# Patient Record
Sex: Male | Born: 1985 | Race: White | Marital: Married | Smoking: Never smoker
Health system: Southern US, Community
[De-identification: ages and names within clinical notes are randomized; demographics above are authoritative.]

## PROBLEM LIST (undated history)

## (undated) DIAGNOSIS — E785 Hyperlipidemia, unspecified: Secondary | ICD-10-CM

## (undated) DIAGNOSIS — F909 Attention-deficit hyperactivity disorder, unspecified type: Secondary | ICD-10-CM

## (undated) HISTORY — DX: Attention-deficit hyperactivity disorder, unspecified type: F90.9

## (undated) HISTORY — DX: Hyperlipidemia, unspecified: E78.5

---
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
## 2006-03-14 HISTORY — PX: OTHER SURGICAL HISTORY: SHX169

## 2021-08-02 ENCOUNTER — Other Ambulatory Visit: Payer: Self-pay | Admitting: General Surgery

## 2021-08-02 ENCOUNTER — Other Ambulatory Visit (HOSPITAL_COMMUNITY): Payer: Self-pay | Admitting: General Surgery

## 2021-08-02 DIAGNOSIS — G8929 Other chronic pain: Secondary | ICD-10-CM

## 2021-08-11 ENCOUNTER — Ambulatory Visit (HOSPITAL_COMMUNITY): Payer: PRIVATE HEALTH INSURANCE

## 2021-08-11 ENCOUNTER — Encounter (HOSPITAL_COMMUNITY): Payer: Self-pay

## 2021-08-12 ENCOUNTER — Ambulatory Visit (HOSPITAL_COMMUNITY)
Admission: RE | Admit: 2021-08-12 | Discharge: 2021-08-12 | Disposition: A | Discharge status: Home / Self Care | Payer: PRIVATE HEALTH INSURANCE | Source: Ambulatory Visit | Attending: General Surgery | Admitting: General Surgery

## 2021-08-12 DIAGNOSIS — G8929 Other chronic pain: Secondary | ICD-10-CM | POA: Insufficient documentation

## 2021-08-12 DIAGNOSIS — M25512 Pain in left shoulder: Secondary | ICD-10-CM | POA: Insufficient documentation

## 2022-03-08 ENCOUNTER — Encounter: Payer: Self-pay | Admitting: Neurology

## 2022-03-08 ENCOUNTER — Other Ambulatory Visit: Payer: Self-pay

## 2022-03-08 DIAGNOSIS — R202 Paresthesia of skin: Secondary | ICD-10-CM

## 2022-04-06 ENCOUNTER — Encounter: Payer: Self-pay | Admitting: Physical Medicine and Rehabilitation

## 2022-05-02 ENCOUNTER — Encounter: Payer: PRIVATE HEALTH INSURANCE | Admitting: Physical Medicine and Rehabilitation

## 2022-05-02 ENCOUNTER — Encounter
Payer: PRIVATE HEALTH INSURANCE | Attending: Physical Medicine and Rehabilitation | Admitting: Physical Medicine and Rehabilitation

## 2022-05-02 ENCOUNTER — Ambulatory Visit: Payer: PRIVATE HEALTH INSURANCE | Admitting: Neurology

## 2022-05-02 ENCOUNTER — Encounter: Payer: Self-pay | Admitting: Physical Medicine and Rehabilitation

## 2022-05-02 VITALS — BP 133/87 | HR 95 | Ht 74.0 in | Wt 250.4 lb

## 2022-05-02 DIAGNOSIS — G8929 Other chronic pain: Secondary | ICD-10-CM

## 2022-05-02 DIAGNOSIS — R29898 Other symptoms and signs involving the musculoskeletal system: Secondary | ICD-10-CM

## 2022-05-02 DIAGNOSIS — M25512 Pain in left shoulder: Secondary | ICD-10-CM | POA: Insufficient documentation

## 2022-05-02 DIAGNOSIS — R202 Paresthesia of skin: Secondary | ICD-10-CM | POA: Insufficient documentation

## 2022-05-02 DIAGNOSIS — R200 Anesthesia of skin: Secondary | ICD-10-CM

## 2022-05-02 DIAGNOSIS — M62838 Other muscle spasm: Secondary | ICD-10-CM

## 2022-05-02 MED ORDER — GABAPENTIN 100 MG PO CAPS: 100.0000 mg | ORAL_CAPSULE | Freq: Every day | ORAL | 5 refills | Status: DC

## 2022-05-02 NOTE — Patient Instructions (Signed)
Voltaren gel to the left posterior shoulder up to 4 x dialy  Gabapentin 100 mg at nighttime  I will order MRI of your neck  Follow up in 1 month

## 2022-05-02 NOTE — Progress Notes (Unsigned)
Subjective:    Patient ID: Paul Woodard, male    DOB: 1985-10-02, 37 y.o.   MRN: [id number]  HPI   Pain Inventory Average Pain 2 Pain Right Now 2  My pain is {PAIN DESCRIPTION:21022940}  LOCATION OF PAIN  Pain, Weakness & Numbness on left side of the body from shoulder down to calf.  BOWEL Number of stools per week: 14   BLADDER Normal    Mobility walk without assistance how many minutes can you walk? All day ability to climb steps?  yes Do you have any goals in this area?  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  yes  Neuro/Psych weakness numbness tingling spasms loss of taste or smell  Prior Studies Any changes since last visit?  no CT/MRI In [facility] today   Physicians involved in your care Any changes since last visit?  no  HPI  Paul Woodard is a 37 y.o. year old male  who  has a past medical history of ADHD and Hyperlipidemia.   They are presenting to PM&R clinic as a new patient for pain management evaluation. They were referred by @APPTREFPROVNAME$ @ for treatment of L arm  pain.   Source: L arm, neck radiating numbness/tingling into middle 3 fingers of the hand; L leg numbness/tingling 4 months ago Inciting incident: Jumped 2 steps and leg went out; has been weak since then; L leg atrophy in thigh and calves  Description of pain: intermittent and aching, sharp with shoulder posterior rotation Exacerbating factors: Bringing shoulder back, laying without pillow on side or back Remitting factors: nothing; no association with time of day Red flag symptoms: No red flags for back pain endorsed in Hx or ROS  Medications tried: Topical medications (never tried) :  Nsaids (never tried) :  Tylenol  (never tried) :  Opiates  (never tried) :  Gabapentin / Lyrica  (never tried) :  TCAs  (never tried) :  SNRIs  (never tried) :  Other  (never tried) :   Other treatments: PT/OT   (moderate effect) :  Accupuncture/chiropractor/massage  (never tried) :  TENs unit (no effect) :  Injections (mild effect) : Dry needling Surgery (never tried) :   Had EMG this morning BL UE and LLE at [facility]; both that and the last EMG he had were normal  Prior UDS results: No results found for: "LABOPIA", "COCAINSCRNUR", "LABBENZ", "AMPHETMU", "THCU", "LABBARB"   ROS: Review of Systems  HENT:         Loss of taste / smell - COVID Related  Musculoskeletal:        Spasms  Neurological:  Positive for weakness and numbness.       Tingling  All other systems reviewed and are negative.   PE: Constitution: Appropriate appearance for age. No apparent distress HEENT: PERRL, EOMI grossly intact.  Resp: CTAB. No rales, rhonchi, or wheezing. Cardio: RRR. No mumurs, rubs, or gallops. No peripheral edema. Abdomen: Nondistended. Nontender. +bowel sounds. Psych: Appropriate mood and affect. Neuro: AAOx4.   Neurologic Exam:   DTRs: Reflexes were 2+ in bilateral achilles, biceps, BR and triceps; 1+ L patella Babinsky: flexor responses b/l.   Hoffmans: negative b/l Sensory exam: revealed normal sensation in all dermatomal regions in bilateral upper extremities and bilateral lower extremities Motor exam: strength 5/5 throughout bilateral upper extremities and bilateral lower extremities + Subtle reduced bulk of L hamstrings and calves compared to R  Coordination: Fine motor coordination was normal.   Gait: normal Balance: Decreased in L foot single leg stance compared to R. Can tandem walk, heel and toe walk without difficulty. Can keep balances on both feet with eyes closed.  + Spurling's with head sidebent R, symptoms on the L Negative Lhermitte's  Negative facet loading Negative slump  Assessment and Plan: Paul Woodard is a 37 y.o. year old male  who  has a past medical history of ADHD and Hyperlipidemia.   They are presenting to PM&R clinic as a new patient for treatment of L ARM  pain.   There are no diagnoses linked to this encounter.      No family history on file. Social History   Socioeconomic History   Marital status: Married    Spouse name: Not on file   Number of children: Not on file   Years of education: Not on file   Highest education level: Not on file  Occupational History   Not on file  Tobacco Use   Smoking status: Not on file   Smokeless tobacco: Not on file  Substance and Sexual Activity   Alcohol use: Not on file   Drug use: Not on file   Sexual activity: Not on file  Other Topics Concern   Not on file  Social History Narrative   Not on file   Social Determinants of Health   Financial Resource Strain: Not on file  Food Insecurity: Not on file  Transportation Needs: Not on file  Physical Activity: Not on file  Stress: Not on file  Social Connections: Not on file   *** The histories are not reviewed yet. Please review them in the "History" navigator section and refresh this [place]. No past medical history on file. There were no vitals taken for this visit.  Opioid Risk Score:   Fall Risk Score:  `1  Depression screen PHQ 2/9      No data to display          Review of Systems  HENT:         Loss of taste / smell - COVID Related  Musculoskeletal:        Spasms  Neurological:  Positive for weakness and numbness.       Tingling  All other systems reviewed and are negative.      Objective:   Physical Exam        Assessment & Plan:

## 2022-05-02 NOTE — Procedures (Signed)
[facility]  [address], [address]  [place], [place] [address] Tel: [phone] Fax: [phone] Test Date:  05/02/2022  Patient: Paul Woodard DOB: 02-24-1986 Physician: Kai Levins, MD  Sex: Male   Ref Phys: Shelly Rubenstein, DO  ID#: [id number]   Technician:    History: This is a 37 year old male with numbness in left arm and weakness in left leg.  NCV & EMG Findings: Extensive electrodiagnostic evaluation of the left upper and lower limbs with additional nerve conduction studies of the right lower limb shows: Left sural, superficial peroneal/fibular, median, ulnar, and radial sensory responses are within normal limits. Left peroneal/fibular (EDB), tibial (AH), median (APB), and ulnar (ADM) motor responses are within normal limits. Bilateral H reflexes are absent, likely technical in nature. There is no evidence of active or chronic motor axon loss changes affecting any of the tested muscles on needle examination. Motor unit configuration and recruitment pattern is within normal limits.  Impression: This is a normal study. There is no definitive electrodiagnostic evidence of a large fiber sensorimotor polyneuropathy, left cervical (C5-C8) or left lumbosacral (L3-S1) motor radiculopathy, left brachial plexopathy, or left median or ulnar mononeuropathy.    ___________________________ Kai Levins, MD    Nerve Conduction Studies Motor Nerve Results    Latency Amplitude F-Lat Segment Distance CV Comment  Site (ms) Norm (mV) Norm (ms)  (cm) (m/s) Norm   Left Fibular (EDB) Motor  Ankle 3.7  < 5.5 8.9  > 3.0        Bel fib head 11.8 - 8.1 -  Bel fib head-Ankle 37 46  > 40   Pop fossa 14.1 - 7.7 -  Pop fossa-Bel fib head 9.5 41 -   Left Median (APB) Motor  Wrist 2.9  < 3.9 7.1  > 6.0        Elbow 7.8 - 6.9 -  Elbow-Wrist 29 59  > 50   Left Tibial (AH) Motor  Ankle 3.8  < 6.0 13.3  > 8.0        Knee 13.8 - 11.9 -  Knee-Ankle 47 47  > 40   Left Ulnar (ADM) Motor   Wrist 1.95  < 3.1 13.8  > 7.0        Bel elbow 5.7 - 13.4 -  Bel elbow-Wrist 23 61  > 50   Ab elbow 7.7 - 13.0 -  Ab elbow-Bel elbow 10 50 -    Sensory Sites    Neg Peak Lat Amplitude (O-P) Segment Distance Velocity Comment  Site (ms) Norm (V) Norm  (cm) (ms)   Left Median Sensory  Wrist-Dig II 2.8  < 3.4 27  > 20 Wrist-Dig II 13    Left Radial Sensory  Forearm-Wrist 2.1  < 2.7 20  > 18 Forearm-Wrist 10    Left Superficial Fibular Sensory  14 cm-Ankle 3.3  < 4.5 13  > 5 14 cm-Ankle 14    Left Sural Sensory  Calf 4.2  < 4.5 6  > 5 Calf-Lat mall 14    Left Ulnar Sensory  Wrist-Dig V 2.7  < 3.1 18  > 12 Wrist-Dig V 11     H-Reflex Results    M-Lat H Lat H Neg Amp H-M Lat  Site (ms) (ms) Norm (mV) (ms)  Left Tibial H-Reflex  Pop fossa 7.7 -  < 35.0 - -  Right Tibial H-Reflex  Pop fossa 5.7 -  < 35.0 - -   Electromyography   Side Muscle  Ins.Act Fibs Fasc Recrt Amp Dur Poly Activation Comment  Left Tib ant Nml Nml Nml Nml Nml Nml Nml Nml N/A  Left Gastroc MH Nml Nml Nml Nml Nml Nml Nml Nml N/A  Left FDL Nml Nml Nml Nml Nml Nml Nml Nml N/A  Left Rectus fem Nml Nml Nml Nml Nml Nml Nml Nml N/A  Left Biceps fem SH Nml Nml Nml Nml Nml Nml Nml Nml N/A  Left Gluteus med Nml Nml Nml Nml Nml Nml Nml Nml N/A  Left FDI Nml Nml Nml Nml Nml Nml Nml Nml N/A  Left EIP Nml Nml Nml Nml Nml Nml Nml Nml N/A  Left FPL Nml Nml Nml Nml Nml Nml Nml Nml N/A  Left Pronator teres Nml Nml Nml Nml Nml Nml Nml Nml N/A  Left Biceps Nml Nml Nml Nml Nml Nml Nml Nml N/A  Left Triceps lat hd Nml Nml Nml Nml Nml Nml Nml Nml N/A  Left Deltoid Nml Nml Nml Nml Nml Nml Nml Nml N/A      Waveforms:  Motor           Sensory             H-Reflex

## 2022-05-03 DIAGNOSIS — R29898 Other symptoms and signs involving the musculoskeletal system: Secondary | ICD-10-CM | POA: Insufficient documentation

## 2022-05-03 DIAGNOSIS — R200 Anesthesia of skin: Secondary | ICD-10-CM | POA: Insufficient documentation

## 2022-05-03 DIAGNOSIS — R202 Paresthesia of skin: Secondary | ICD-10-CM | POA: Insufficient documentation

## 2022-05-03 DIAGNOSIS — G8929 Other chronic pain: Secondary | ICD-10-CM | POA: Insufficient documentation

## 2022-05-04 NOTE — Assessment & Plan Note (Signed)
Concerned for spinal cord related pathology either from cervical myelopathy or lumbosacral pathology, possibly worsened s/p fall. Will order MRI cervical, thoracic, and lumbar spine to evaluate for possible cord impingement. Will call patient with results.

## 2022-05-04 NOTE — Assessment & Plan Note (Signed)
See above; start gabapentin  Will follow up next visit

## 2022-05-04 NOTE — Assessment & Plan Note (Addendum)
Had EMG this morning BL UE and LLE at [facility]; reviewed results independently,  all findings were well within normal limits and essentially rule out severe radiculopathy, large fiber polyneuropathy.  Start gabapentin 100 mg QHS for neuropathy and spasms; may increase if tolerated  Follow up in 1 month

## 2022-05-21 ENCOUNTER — Ambulatory Visit
Admission: RE | Admit: 2022-05-21 | Discharge: 2022-05-21 | Disposition: A | Discharge status: Home / Self Care | Payer: PRIVATE HEALTH INSURANCE | Source: Ambulatory Visit | Attending: Physical Medicine and Rehabilitation | Admitting: Physical Medicine and Rehabilitation

## 2022-05-21 DIAGNOSIS — R200 Anesthesia of skin: Secondary | ICD-10-CM

## 2022-05-21 DIAGNOSIS — M62838 Other muscle spasm: Secondary | ICD-10-CM

## 2022-05-21 DIAGNOSIS — R29898 Other symptoms and signs involving the musculoskeletal system: Secondary | ICD-10-CM

## 2022-05-21 DIAGNOSIS — G8929 Other chronic pain: Secondary | ICD-10-CM

## 2022-05-24 ENCOUNTER — Telehealth: Payer: Self-pay | Admitting: Physical Medicine and Rehabilitation

## 2022-05-24 NOTE — Telephone Encounter (Signed)
Reviewed results of MRI as above, discussed referral for cervical ESI vs. Medication management, patient opted to increase gabapentin to 100 mg TID until follow up.  Also discussed leg weakness and muscle bulk reduced on L side; with moderate stenosis and no EMG findings unsure of cause at this time, will re-evaluate on follow up.   Paul Gowda, DO 05/24/2022

## 2022-06-06 ENCOUNTER — Encounter
Payer: PRIVATE HEALTH INSURANCE | Attending: Physical Medicine and Rehabilitation | Admitting: Physical Medicine and Rehabilitation

## 2022-06-06 VITALS — BP 139/84 | HR 77 | Ht 74.0 in | Wt 254.0 lb

## 2022-06-06 DIAGNOSIS — R200 Anesthesia of skin: Secondary | ICD-10-CM | POA: Diagnosis not present

## 2022-06-06 DIAGNOSIS — M25512 Pain in left shoulder: Secondary | ICD-10-CM | POA: Insufficient documentation

## 2022-06-06 DIAGNOSIS — R202 Paresthesia of skin: Secondary | ICD-10-CM | POA: Insufficient documentation

## 2022-06-06 DIAGNOSIS — G8929 Other chronic pain: Secondary | ICD-10-CM | POA: Diagnosis not present

## 2022-06-06 DIAGNOSIS — R29898 Other symptoms and signs involving the musculoskeletal system: Secondary | ICD-10-CM | POA: Insufficient documentation

## 2022-06-06 MED ORDER — GABAPENTIN 300 MG PO CAPS: ORAL_CAPSULE | ORAL | 3 refills | Status: AC

## 2022-06-06 NOTE — Patient Instructions (Signed)
I have sent a prescription for gabapentin into your pharmacy.  Please take one 300 mg capsule twice a day.  If no side effects at this dose, can increase to one 300 mg capsule 3 times daily.  I have referred you to physical therapy for your neck and shoulder.  I referred you to neurology for neuromuscular evaluation for your left lower extremity.  Follow-up with me in 2 months.

## 2022-06-06 NOTE — Progress Notes (Signed)
Subjective:    Patient ID: Paul Woodard, male    DOB: 1985-10-11, 37 y.o.   MRN: [id number]  HPI Wlliam Woodard is a 37 y.o. year old male  who  has a past medical history of ADHD and Hyperlipidemia.   They are presenting to PM&R clinic as a follow up for treatment of L ARM pain in posterior shoulder + neuropathic pains into L fingers; also symptoms of neuropathy and weakness in LLE worsened s/p fall.     Plan from last visit:    Numbness and tingling Assessment & Plan: Had EMG this morning BL UE and LLE at [facility]; reviewed results independently,  all findings were well within normal limits and essentially rule out severe radiculopathy, large fiber polyneuropathy.   Start gabapentin 100 mg QHS for neuropathy and spasms; may increase if tolerated   Follow up in 1 month   Orders: -     MR CERVICAL SPINE [place]; Future -     MR THORACIC SPINE WO CONTR[place]; Future -     MR LUMBAR SPINE WO CONTRAST; Future   Chronic left shoulder pain Assessment & Plan: See above; start gabapentin   Will follow up next visit   Orders: -     MR CERVICAL SPINE [place]; Future -     MR THORACIC SPINE WO CONTRAST; Future   Left leg weakness Assessment & Plan: Concerned for spinal cord related pathology either from cervical myelopathy or lumbosacral pathology, possibly worsened s/p fall. Will order MRI cervical, thoracic, and lumbar spine to evaluate for possible cord impingement. Will call patient with results.    Orders: -     MR CERVICAL SPINE WO CONTRAST; Future -     MR THORACIC SPINE WO CONTRAST; Future -     MR LUMBAR SPINE WO CONTRAST; Future   Muscle spasm of left lower extremity -     MR CERVICAL SPINE [place] CONTR[place]; Future -     MR THORACIC SPINE WO CONTRAST; Future -     MR LUMBAR SPINE WO CONTRAST; Future   Other orders -     Gabapentin; Take 1 capsule (100 mg total) by mouth at bedtime.  Dispense: 30 capsule; Refill: 5    Reviewed results of MRI as above, discussed  referral for cervical ESI vs. Medication management, patient opted to increase gabapentin to 100 mg TID until follow up.   Also discussed leg weakness and muscle bulk reduced on L side; with moderate stenosis and no EMG findings unsure of cause at this time, will re-evaluate on follow up.       Interval Hx:  - Therapies: none  - Follow ups: none  - Falls: none  - DME: none  - Medications: No difference in symptoms with Gabapentin TID.   - Other concerns: Ongoing concerns with loss of muscle bulk and weakness in LLE.     Pain Inventory Average Pain 5 Pain Right Now 5 My pain is intermittent, sharp, burning, tingling, and numbness  In the last 24 hours, has pain interfered with the following? General activity 6 Relation with others 6 Enjoyment of life 6 What TIME of day is your pain at its worst? morning , daytime, evening, and night Sleep (in general) Fair  Pain is worse with: walking, bending, inactivity, standing, and some activites Pain improves with:  nothing Relief from Meds: 2  No family history on file. Social History   Socioeconomic History   Marital status: Married    Spouse name:  Not on file   Number of children: Not on file   Years of education: Not on file   Highest education level: Not on file  Occupational History   Not on file  Tobacco Use   Smoking status: Never   Smokeless tobacco: Never  Vaping Use   Vaping Use: Never used  Substance and Sexual Activity   Alcohol use: Yes    Comment: daily beer   Drug use: Not Currently   Sexual activity: Not on file  Other Topics Concern   Not on file  Social History Narrative   Not on file   Social Determinants of Health   Financial Resource Strain: Not on file  Food Insecurity: Not on file  Transportation Needs: Not on file  Physical Activity: Not on file  Stress: Not on file  Social Connections: Not on file   Past Surgical History:  Procedure Laterality Date   right shoulder rpair Right 2008    [place]   Past Surgical History:  Procedure Laterality Date   right shoulder rpair Right 2008   [place]   Past Medical History:  Diagnosis Date   ADHD    Hyperlipidemia    BP 139/84   Pulse 77   Ht 6\' 2"  (1.88 m)   Wt 254 lb (115.2 kg)   SpO2 98%   BMI 32.61 kg/m   Opioid Risk Score:   Fall Risk Score:  `1  Depression screen [facility] 2/9     05/02/2022    2:38 PM  Depression screen PHQ 2/9  Decreased Interest 1  Down, Depressed, Hopeless 0  PHQ - 2 Score 1  Altered sleeping 2  Tired, decreased energy 2  Change in appetite 2  Feeling bad or failure about yourself  0  Trouble concentrating 3  Moving slowly or fidgety/restless 2  Suicidal thoughts 0  PHQ-9 Score 12     Review of Systems  Musculoskeletal:        Whole left side pain  All other systems reviewed and are negative.     Objective:   Physical Exam   PE: Constitution: Appropriate appearance for age. No apparent distress  Resp: No respiratory distress. No accessory muscle usage. on RA and CTAB Cardio: Well perfused appearance. No peripheral edema. Abdomen: Nondistended. Nontender.   Psych: Appropriate mood and affect. Neuro: AAOx4. No apparent cognitive deficits   Neurologic Exam:   DTRs: Reflexes were 2+ in bilateral achilles, patella, biceps, BR and triceps. Babinsky: flexor responses b/l.   Hoffmans: negative b/l Sensory exam: revealed normal sensation in all dermatomal regions in bilateral upper extremities and bilateral lower extremities Motor exam: strength 5/5 throughout right lower extremity and with exception of LLE deceased bulk in calf, quad; 4+/5 in HF and PF  Coordination: Fine motor coordination was normal.   Gait: normal   Neck: Decreased ROM neck flexion, extension, and L>R rotation'; sidebending WNL Negative spurling's +TTP over left trapezius with numb/tingling around neck       Assessment & Plan:   Paul Woodard is a 37 y.o. year old male  who  has a past medical history of ADHD and Hyperlipidemia.   They are presenting to PM&R clinic as a new patient for treatment of L ARM pain in posterior shoulder + neuropathic pains into L fingers; weakness in LLE worsened s/p fall.  Chronic left shoulder pain Assessment & Plan: EMG BL UE wi[place] normal  MRI cervical spine 2024:  IMPRESSION: 1. Disc degeneration with multiple disc protrusions, most notable at C5-6 where there is mild-to-moderate spinal stenosis and severe left neural foraminal stenosis. 2. Mild spinal stenosis and moderate left neural foraminal stenosis at C4-5. 3. Mild spinal stenosis and moderate right neural foraminal stenosis at C6-7.  I have referred you to physical therapy for your neck and shoulder.  Orders: -     Ambulatory referral to Physical Therapy  Left leg weakness Assessment & Plan: LLE EMG with [place] normal  MRI lumbar c/t/l spine 2024 without severe spinal stenosis; lumbar findings:  IMPRESSION: 1. 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 No compressive spinal stenosis. 2. Moderate left and mild right neural foraminal stenosis at L4-5.  I referred you to neurology for neuromuscular evaluation for your left lower extremity.  Follow-up with me in 2 months.  Orders: -     Ambulatory referral to Neurology  Numbness and tingling Assessment & Plan: I have sent a prescription for gabapentin into your pharmacy.  Please take one 300 mg capsule twice a day.  If no side effects at this dose, can increase to one 300 mg capsule 3 times daily.   Orders: -     Ambulatory referral to Physical Therapy  Other orders -     Gabapentin; Start with 1 capsule twice daily. After 1 week, if no side effects, can increase to 1 capsule three times daily  Dispense: 90 capsule; Refill: 3

## 2022-06-11 NOTE — Assessment & Plan Note (Addendum)
LLE EMG with [place] normal  MRI lumbar c/t/l spine 2024 without severe spinal stenosis; lumbar findings:  IMPRESSION: 1. 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 No compressive spinal stenosis. 2. Moderate left and mild right neural foraminal stenosis at L4-5.  I referred you to neurology for neuromuscular evaluation for your left lower extremity.  Follow-up with me in 2 months.

## 2022-06-11 NOTE — Assessment & Plan Note (Signed)
I have sent a prescription for gabapentin into your pharmacy.  Please take one 300 mg capsule twice a day.  If no side effects at this dose, can increase to one 300 mg capsule 3 times daily.

## 2022-06-11 NOTE — Assessment & Plan Note (Addendum)
EMG BL UE wiuth [place] normal  MRI cervical spine 2024:  IMPRESSION: 1. Disc degeneration with multiple disc protrusions, most notable at C5-6 where there is mild-to-moderate spinal stenosis and severe left neural foraminal stenosis. 2. Mild spinal stenosis and moderate left neural foraminal stenosis at C4-5. 3. Mild spinal stenosis and moderate right neural foraminal stenosis at C6-7.  I have referred you to physical therapy for your neck and shoulder.

## 2022-06-13 ENCOUNTER — Ambulatory Visit: Payer: PRIVATE HEALTH INSURANCE | Attending: Physical Medicine and Rehabilitation | Admitting: Physical Therapy

## 2022-06-13 DIAGNOSIS — R200 Anesthesia of skin: Secondary | ICD-10-CM | POA: Insufficient documentation

## 2022-06-13 DIAGNOSIS — M25512 Pain in left shoulder: Secondary | ICD-10-CM | POA: Diagnosis not present

## 2022-06-13 DIAGNOSIS — R202 Paresthesia of skin: Secondary | ICD-10-CM | POA: Diagnosis not present

## 2022-06-13 DIAGNOSIS — M6281 Muscle weakness (generalized): Secondary | ICD-10-CM | POA: Diagnosis present

## 2022-06-13 DIAGNOSIS — M542 Cervicalgia: Secondary | ICD-10-CM | POA: Diagnosis present

## 2022-06-13 DIAGNOSIS — M5412 Radiculopathy, cervical region: Secondary | ICD-10-CM | POA: Insufficient documentation

## 2022-06-13 DIAGNOSIS — G8929 Other chronic pain: Secondary | ICD-10-CM | POA: Diagnosis not present

## 2022-06-13 NOTE — Therapy (Signed)
OUTPATIENT PHYSICAL THERAPY CERVICAL EVALUATION   Patient Name: Dillan Tourville MRN: [id number] DOB:[birth year], 37 y.o., male Today's Date: 06/13/2022  END OF SESSION:  PT End of Session - 06/13/22 1323     Visit Number 1    Number of Visits 5   with eval   Date for PT Re-Evaluation 07/25/22    Authorization Type Generic Commercial    PT Start Time 1322   pt arrived late   PT Stop Time 1355   eval   PT Time Calculation (min) 33 min    Activity Tolerance Patient tolerated treatment well    Behavior During Therapy [facility] for tasks assessed/performed             Past Medical History:  Diagnosis Date   ADHD    Hyperlipidemia    Past Surgical History:  Procedure Laterality Date   right shoulder rpair Right 2008   [place]   Patient Active Problem List   Diagnosis Date Noted   Numbness and tingling 05/03/2022   Chronic left shoulder pain 05/03/2022   Left leg weakness 05/03/2022    PCP: Marcille Buffy, DO  REFERRING PROVIDER: Gertie Gowda, DO  REFERRING DIAG: [phone] (ICD-10-CM) - Chronic left shoulder pain R20.0,R20.2 (ICD-10-CM) - Numbness and tingling  THERAPY DIAG:  Muscle weakness (generalized)  Cervicalgia  Radiculopathy, cervical region  Rationale for Evaluation and Treatment: Rehabilitation  ONSET DATE: 06/06/2022 (date of referral)  SUBJECTIVE:                                                                                                                                                                                                         SUBJECTIVE STATEMENT: Pt reports he has been having L arm tingling and pain that started over a year ago and kept getting worse. Pt states that this starts with pain/tightness at the base of his neck that then travels down his arm, he would say that it bothers him daily/constantly. Pt also with muscle loss in his LLE and with sporadic but shooting pain in his quads. Pt reports  that he had has issues in his LLE for about 6 months. When descending the stairs pt's LLE feels "weak" and "foreign". Pt reports that cervical retraction severely increases the symptoms in his LUE so much so that the symptoms can linger for days after performing this movement. Pt also reports that just being sedentary makes his symptoms worse. Pt did try PT about 6-8 months ago (at [facility]) and they did dry needling for his neck  and shoulders, stretching, cervical traction etc with minimal relief of symptoms. Pt also plays darts and throws with his L hand.  Hand dominance: Left  PERTINENT HISTORY:  ADHD and Hyperlipidemia  PAIN:  Are you having pain? Yes: NPRS scale: discomfort/10 Pain location: L neck and down into L arm Pain description: "uncomfortable", numbness if sitting a certain way, can feel it creeping into R side Aggravating factors: sitting a certain way, can't lay on R side with L arm extended, can't sit at desk for more than 5 min Relieving factors: N/A  PRECAUTIONS: None  WEIGHT BEARING RESTRICTIONS: No  FALLS:  Has patient fallen in last 6 months? No  LIVING ENVIRONMENT: Lives with: lives with their family (wife) Lives in: House/apartment Stairs:  Yes, does not need to use rails currently Has following equipment at home: None  OCCUPATION: Scientist, clinical (histocompatibility and immunogenetics); symptoms have not limited ability to work (lifts heavy kegs, food boxes, etc)  PLOF: Independent with gait and Independent with transfers  PATIENT GOALS: "I want to get rid of the tingling" "Feel healthy again"  NEXT MD VISIT: Aug 03, 2022 with Dr. Tressa Busman  OBJECTIVE:   DIAGNOSTIC FINDINGS:   Cervical Spine MRI 05/21/22 IMPRESSION: 1. Disc degeneration with multiple disc protrusions, most notable at C5-6 where there is mild-to-moderate spinal stenosis and severe left neural foraminal stenosis. 2. Mild spinal stenosis and moderate left neural foraminal stenosis at C4-5. 3. Mild spinal  stenosis and moderate right neural foraminal stenosis at C6-7.  Thoracic Spine MRI 05/21/22 IMPRESSION: Small disc protrusion at T4-5 without stenosis.  Lumbar Spine MRI 05/21/22 IMPRESSION: 1. 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 No compressive spinal stenosis. 2. Moderate left and mild right neural foraminal stenosis at L4-5.  LUE and LLE EMG 05/02/22 Impression: This is a normal study. There is no definitive electrodiagnostic evidence of a large fiber sensorimotor polyneuropathy, left cervical (C5-C8) or left lumbosacral (L3-S1) motor radiculopathy, left brachial plexopathy, or left median or ulnar mononeuropathy.  PATIENT SURVEYS:  NDI 12/50  COGNITION: Overall cognitive status: Within functional limits for tasks assessed  SENSATION: WFL Light touch sensation intact in BUE and BLE Proprioception intact  LLE just feels "weak" at times such as when descending stairs  POSTURE: rounded shoulders and forward head  PALPATION: TTP in L UT and periscapular region; tightness in L UT with L scapula abducted   CERVICAL ROM:   Active ROM A/PROM (deg) eval  Flexion 45  Extension 25 (pain in L levator scap)  Right lateral flexion 30 (pain in R lev scap)  Left lateral flexion 30 (pain and tingling in L levator scap)  Right rotation 45 (tingling in L side)  Left rotation 50   (Blank rows = not tested)  UPPER EXTREMITY ROM:  Active ROM Right eval Left eval  Shoulder flexion [facility]  Shoulder extension    Shoulder abduction [facility]  Shoulder adduction    Shoulder extension    Shoulder internal rotation    Shoulder external rotation    Elbow flexion [facility] WFL  Elbow extension [facility] WFL  Wrist flexion    Wrist extension    Wrist ulnar deviation    Wrist radial deviation    Wrist pronation    Wrist supination     (Blank rows = not tested)  UPPER EXTREMITY MMT:  MMT Right eval Left eval  Shoulder flexion 5 5  Shoulder extension    Shoulder  abduction 5 5  Shoulder adduction    Shoulder extension  Shoulder internal rotation    Shoulder external rotation    Middle trapezius    Lower trapezius    Elbow flexion 5 5  Elbow extension 5 5  Wrist flexion    Wrist extension    Wrist ulnar deviation    Wrist radial deviation    Wrist pronation    Wrist supination    Grip strength WFL WFL   (Blank rows = not tested)  CERVICAL SPECIAL TESTS:  Upper limb tension test (ULTT): Positive, Spurling's test: Positive, and Distraction test: Positive (+ for median nerve on L side)   TODAY'S TREATMENT:                                                                                                                              PT Evaluation   PATIENT EDUCATION:  Education details: Eval findings, PT POC Person educated: Patient Education method: Explanation Education comprehension: verbalized understanding and needs further education  HOME EXERCISE PROGRAM: To be initiated next session  ASSESSMENT:  CLINICAL IMPRESSION: Patient is a 37 year old male referred to Neuro OPPT for chronic L-sided neck pain and cervical radiculopathy.   Pt's PMH is significant for: ADHD and Hyperlipidemia. The following deficits were present during the exam: decreased cervical ROM, increased disability level based on NDI, + neural tension symptoms down LUE. Pt would benefit from skilled PT to address these impairments and functional limitations to maximize functional mobility independence.   OBJECTIVE IMPAIRMENTS: decreased knowledge of condition, decreased mobility, decreased ROM, impaired perceived functional ability, impaired sensation, impaired UE functional use, improper body mechanics, postural dysfunction, and pain.   ACTIVITY LIMITATIONS: sitting, sleeping, and stairs  PARTICIPATION LIMITATIONS: community activity  PERSONAL FACTORS: Time since onset of injury/illness/exacerbation and 1-2 comorbidities:    ADHD and Hyperlipidemiaare also  affecting patient's functional outcome.   REHAB POTENTIAL: Fair time since onset; has tried PT in the past year with little success  CLINICAL DECISION MAKING: Stable/uncomplicated  EVALUATION COMPLEXITY: Low   GOALS: Goals reviewed with patient? Yes  SHORT TERM GOALS=LONG TERM GOALS due to length of POC  LONG TERM GOALS: Target date: 07/12/2022  Pt will be independent with final HEP for improved strength, balance, transfers and gait. Baseline: not established at eval Goal status: INITIAL  2.  Pt will decrease score on NDI to 7/50 to demonstrate decreased disability level Baseline: 12/50 (4/1) Goal status: INITIAL  3.  Pt will demonstrate (-) testing of ULNT for median nerve in LUE Baseline: (+) for median nerve in LUE (4/1) Goal status: INITIAL  4.  LEFS to be assessed and goal written Baseline:  Goal status: INITIAL  5.  Pt will increase cervical extension by >/= 10 degrees to demonstrate improved function Baseline: 25 degrees cervical extension (4/1) Goal status: INITIAL    PLAN:  PT FREQUENCY: 1x/week  PT DURATION: 4 weeks  PLANNED INTERVENTIONS: Therapeutic exercises, Therapeutic activity, Neuromuscular re-education, Balance training, Gait training, Patient/Family education, Self Care, Joint  mobilization, Stair training, Dry Needling, Electrical stimulation, Spinal manipulation, Spinal mobilization, Cryotherapy, Moist heat, Taping, Traction, Manual therapy, and Re-evaluation  PLAN FOR NEXT SESSION: assess LEFS and write LTG, initiate HEP for ROM (levator scap strech, UT stretches), L scapular stability and postural stabilization exercises; also work on LLE strengthening; manual therapy (cervical traction)   Excell Seltzer, PT, DPT, CSRS 06/13/2022, 3:26 PM

## 2022-06-23 ENCOUNTER — Ambulatory Visit: Payer: PRIVATE HEALTH INSURANCE | Admitting: Physical Therapy

## 2022-06-23 ENCOUNTER — Encounter: Payer: Self-pay | Admitting: Physical Therapy

## 2022-06-23 DIAGNOSIS — M6281 Muscle weakness (generalized): Secondary | ICD-10-CM | POA: Diagnosis not present

## 2022-06-23 DIAGNOSIS — M5412 Radiculopathy, cervical region: Secondary | ICD-10-CM

## 2022-06-23 DIAGNOSIS — M542 Cervicalgia: Secondary | ICD-10-CM

## 2022-06-23 NOTE — Patient Instructions (Signed)
Access Code: [email or web address] URL: [email or web address] Date: 06/23/2022 Prepared by: Camille Bal  Exercises - Seated Upper Trapezius Stretch  - 1 x daily - 4-5 x weekly - 1 sets - 2 reps - 45 seconds hold - Seated Levator Scapulae Stretch  - 1 x daily - 4-5 x weekly - 3 sets - 10 reps - Scapular retraction with resistance  - 1 x daily - 5 x weekly - 2 sets - 12 reps - Standing Median Nerve Glide  - 1 x daily - 5 x weekly - 2 sets - 10 reps - Seated Sciatic Tensioner  - 1 x daily - 5 x weekly - 1-2 sets - 12 reps

## 2022-06-23 NOTE — Therapy (Signed)
OUTPATIENT PHYSICAL THERAPY CERVICAL TREATMENT   Patient Name: Paul Woodard MRN: [id number] DOB:11/09/1985, 37 y.o., male Today's Date: 06/23/2022  END OF SESSION:  PT End of Session - 06/23/22 0848     Visit Number 2    Number of Visits 5   with eval   Date for PT Re-Evaluation 07/25/22    Authorization Type Generic Commercial    PT Start Time 0848    PT Stop Time 0926    PT Time Calculation (min) 38 min    Activity Tolerance Patient tolerated treatment well    [facility] tasks assessed/performed             Past Medical History:  Diagnosis Date   ADHD    Hyperlipidemia    Past Surgical History:  Procedure Laterality Date   right shoulder rpair Right 2008   TONSILLECTOMY AND ADENOIDECTOMY  1998   Patient Active Problem List   Diagnosis Date Noted   Numbness and tingling 05/03/2022   Chronic left shoulder pain 05/03/2022   Left leg weakness 05/03/2022    PCP: Ed BlalockAnderson, Amber H, DO  REFERRING PROVIDER: Angelina SheriffEngler, Morgan C, DO  REFERRING DIAG: [phone],G89.29 (ICD-10-CM) - Chronic left shoulder pain R20.0,R20.2 (ICD-10-CM) - Numbness and tingling  THERAPY DIAG:  Muscle weakness (generalized)  Cervicalgia  Radiculopathy, cervical region  Rationale for Evaluation and Treatment: Rehabilitation  ONSET DATE: 06/06/2022 (date of referral)  SUBJECTIVE:                                                                                                                                                                                                         SUBJECTIVE STATEMENT: Patient is having neck pain this morning.  He states his left arm is already going numb.  Hand dominance: Left  PERTINENT HISTORY:  ADHD and Hyperlipidemia  PAIN:  Are you having pain? Yes: NPRS scale: 5-6/10 Pain location: Top left of shoulder blade and base of neck Pain description: "uncomfortable", numbness if sitting a certain way, can feel it creeping into R  side Aggravating factors: sitting a certain way, can't lay on R side with L arm extended, can't sit at desk for more than 5 min Relieving factors: N/A  PRECAUTIONS: None  WEIGHT BEARING RESTRICTIONS: No  FALLS:  Has patient fallen in last 6 months? No  LIVING ENVIRONMENT: Lives with: lives with their family (wife) Lives in: House/apartment Stairs:  Yes, does not need to use rails currently Has following equipment at home: None  OCCUPATION: Naval architectrestaurant manager; symptoms have not limited ability to work (  lifts heavy kegs, food boxes, etc)  PLOF: Independent with gait and Independent with transfers  PATIENT GOALS: "I want to get rid of the tingling" "Feel healthy again"  NEXT MD VISIT: Aug 03, 2022 with Dr. Shearon Stalls  OBJECTIVE:   DIAGNOSTIC FINDINGS:   Cervical Spine MRI 05/21/22 IMPRESSION: 1. Disc degeneration with multiple disc protrusions, most notable at C5-6 where there is mild-to-moderate spinal stenosis and severe left neural foraminal stenosis. 2. Mild spinal stenosis and moderate left neural foraminal stenosis at C4-5. 3. Mild spinal stenosis and moderate right neural foraminal stenosis at C6-7.  Thoracic Spine MRI 05/21/22 IMPRESSION: Small disc protrusion at T4-5 without stenosis.  Lumbar Spine MRI 05/21/22 IMPRESSION: 1. 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 No compressive spinal stenosis. 2. Moderate left and mild right neural foraminal stenosis at L4-5.  LUE and LLE EMG 05/02/22 Impression: This is a normal study. There is no definitive electrodiagnostic evidence of a large fiber sensorimotor polyneuropathy, left cervical (C5-C8) or left lumbosacral (L3-S1) motor radiculopathy, left brachial plexopathy, or left median or ulnar mononeuropathy.  PATIENT SURVEYS:  NDI 12/50  COGNITION: Overall cognitive status: Within functional limits for tasks assessed  SENSATION: WFL Light touch sensation intact in BUE and BLE Proprioception  intact  LLE just feels "weak" at times such as when descending stairs  POSTURE: rounded shoulders and forward head  PALPATION: TTP in L UT and periscapular region; tightness in L UT with L scapula abducted   CERVICAL ROM:   Active ROM A/PROM (deg) eval  Flexion 45  Extension 25 (pain in L levator scap)  Right lateral flexion 30 (pain in R lev scap)  Left lateral flexion 30 (pain and tingling in L levator scap)  Right rotation 45 (tingling in L side)  Left rotation 50   (Blank rows = not tested)  UPPER EXTREMITY ROM:  Active ROM Right eval Left eval  Shoulder flexion [facility]  Shoulder extension    Shoulder abduction [facility]  Shoulder adduction    Shoulder extension    Shoulder internal rotation    Shoulder external rotation    Elbow flexion WFL WFL  Elbow extension [facility] WFL  Wrist flexion    Wrist extension    Wrist ulnar deviation    Wrist radial deviation    Wrist pronation    Wrist supination     (Blank rows = not tested)  UPPER EXTREMITY MMT:  MMT Right eval Left eval  Shoulder flexion 5 5  Shoulder extension    Shoulder abduction 5 5  Shoulder adduction    Shoulder extension    Shoulder internal rotation    Shoulder external rotation    Middle trapezius    Lower trapezius    Elbow flexion 5 5  Elbow extension 5 5  Wrist flexion    Wrist extension    Wrist ulnar deviation    Wrist radial deviation    Wrist pronation    Wrist supination    Grip strength WFL WFL   (Blank rows = not tested)  CERVICAL SPECIAL TESTS:  Upper limb tension test (ULTT): Positive, Spurling's test: Positive, and Distraction test: Positive (+ for median nerve on L side)   TODAY'S TREATMENT:                                                                                                                              -  LLE slump + -LEFS: 64/80 = 80% -PT performs manual cervical traction x10 minutes w/ cervical myofascial release and passive cervical flexion/rotation and  upper trap stretch bilaterally -Upper trap stretch 2x45 seconds each UE, tightness noted bilaterally -Levator scapulae stretch w/ edu on head angle to deepen stretc -Scapular retractions w/ red theraband x12 -Standing median nerve tensioner x12, pt with high level of symptoms in end position requiring breaks throughout due to numbness -Sciatic nerve flossing into tensioner (added tensioner to HEP), edu on anatomy and nerve glide purpose  PATIENT EDUCATION:  Education details: Use nerve glides when symptomatic in addition to when just generally doing HEP. Person educated: Patient Education method: Explanation Education comprehension: verbalized understanding and needs further education  HOME EXERCISE PROGRAM: Access Code: [email or web address] URL: [email or web address] Date: 06/23/2022 Prepared by: Camille Bal  Exercises - Seated Upper Trapezius Stretch  - 1 x daily - 4-5 x weekly - 1 sets - 2 reps - 45 seconds hold - Seated Levator Scapulae Stretch  - 1 x daily - 4-5 x weekly - 3 sets - 10 reps - Scapular retraction with resistance  - 1 x daily - 5 x weekly - 2 sets - 12 reps - Standing Median Nerve Glide  - 1 x daily - 5 x weekly - 2 sets - 10 reps - Seated Sciatic Tensioner  - 1 x daily - 5 x weekly - 1-2 sets - 12 reps  ASSESSMENT:  CLINICAL IMPRESSION: Patient has ongoing appearance of left sided radiculopathy of the cervical region affecting the median nerve distribution primarily.  Initiated HEP to address nerve mobility and cervical and periscapular mobility with appropriate response from patient.  He continues to benefit from PT to address ongoing active symptoms and promote improved symptom management.   OBJECTIVE IMPAIRMENTS: decreased knowledge of condition, decreased mobility, decreased ROM, impaired perceived functional ability, impaired sensation, impaired UE functional use, improper body mechanics, postural dysfunction, and pain.   ACTIVITY LIMITATIONS:  sitting, sleeping, and stairs  PARTICIPATION LIMITATIONS: community activity  PERSONAL FACTORS: Time since onset of injury/illness/exacerbation and 1-2 comorbidities:    ADHD and Hyperlipidemiaare also affecting patient's functional outcome.   REHAB POTENTIAL: Fair time since onset; has tried PT in the past year with little success  CLINICAL DECISION MAKING: Stable/uncomplicated  EVALUATION COMPLEXITY: Low   GOALS: Goals reviewed with patient? Yes  SHORT TERM GOALS=LONG TERM GOALS due to length of POC  LONG TERM GOALS: Target date: 07/12/2022  Pt will be independent with final HEP for improved strength, balance, transfers and gait. Baseline: not established at eval Goal status: INITIAL  2.  Pt will decrease score on NDI to 7/50 to demonstrate decreased disability level Baseline: 12/50 (4/1) Goal status: INITIAL  3.  Pt will demonstrate (-) testing of ULNT for median nerve in LUE Baseline: (+) for median nerve in LUE (4/1) Goal status: INITIAL  4.  LEFS to be assessed and goal written Baseline:  Goal status: INITIAL  5.  Pt will increase cervical extension by >/= 10 degrees to demonstrate improved function Baseline: 25 degrees cervical extension (4/1) Goal status: INITIAL    PLAN:  PT FREQUENCY: 1x/week  PT DURATION: 4 weeks  PLANNED INTERVENTIONS: Therapeutic exercises, Therapeutic activity, Neuromuscular re-education, Balance training, Gait training, Patient/Family education, Self Care, Joint mobilization, Stair training, Dry Needling, Electrical stimulation, Spinal manipulation, Spinal mobilization, Cryotherapy, Moist heat, Taping, Traction, Manual therapy, and Re-evaluation  PLAN FOR NEXT SESSION:  modify HEP prn.  L scapular stability and postural stabilization exercises-rows, lat pulldowns,  pec stretch; also work on LLE strengthening; manual therapy (cervical traction), measure left vs right LE (calves and thighs) for atrophy   Sadie Haber, PT,  DPT 06/23/2022, 9:51 AM

## 2022-06-29 ENCOUNTER — Ambulatory Visit: Payer: PRIVATE HEALTH INSURANCE | Admitting: Physical Therapy

## 2022-06-29 DIAGNOSIS — M6281 Muscle weakness (generalized): Secondary | ICD-10-CM | POA: Diagnosis not present

## 2022-06-29 DIAGNOSIS — M5412 Radiculopathy, cervical region: Secondary | ICD-10-CM

## 2022-06-29 DIAGNOSIS — M542 Cervicalgia: Secondary | ICD-10-CM

## 2022-06-29 NOTE — Therapy (Signed)
OUTPATIENT PHYSICAL THERAPY CERVICAL TREATMENT   Patient Name: Paul Woodard MRN: [id number] DOB:02-20-1986, 37 y.o., male Today's Date: 06/29/2022  END OF SESSION:  PT End of Session - 06/29/22 0802     Visit Number 3    Number of Visits 5   with eval   Date for PT Re-Evaluation 07/25/22    Authorization Type Generic Commercial    PT Start Time 0800    PT Stop Time 0841    PT Time Calculation (min) 41 min    Activity Tolerance Patient tolerated treatment well    Behavior During Therapy [facility] for tasks assessed/performed              Past Medical History:  Diagnosis Date   ADHD    Hyperlipidemia    Past Surgical History:  Procedure Laterality Date   right shoulder rpair Right 2008   TONSILLECTOMY AND ADENOIDECTOMY  1998   Patient Active Problem List   Diagnosis Date Noted   Numbness and tingling 05/03/2022   Chronic left shoulder pain 05/03/2022   Left leg weakness 05/03/2022    PCP: Ed Blalock, DO  REFERRING PROVIDER: Angelina Sheriff, DO  REFERRING DIAG: [phone] (ICD-10-CM) - Chronic left shoulder pain R20.0,R20.2 (ICD-10-CM) - Numbness and tingling  THERAPY DIAG:  Muscle weakness (generalized)  Cervicalgia  Radiculopathy, cervical region  Rationale for Evaluation and Treatment: Rehabilitation  ONSET DATE: 06/06/2022 (date of referral)  SUBJECTIVE:                                                                                                                                                                                                         SUBJECTIVE STATEMENT: Pt reports that his muscles are always a little bit tighter in the morning, no real change in symptoms since last visit. Pt continues to report that certain positions bring on LUE numbness such as sitting at a desk. Pt is feeling sore after doing exercises and nerve tensioners, especially in his L hamstrings. Pt's wife has noted that his L hip "protrudes" and he also notes skin  changes at L ankle and down into his foot that he does not have on his R side.  Hand dominance: Left  PERTINENT HISTORY:  ADHD and Hyperlipidemia  PAIN:  Are you having pain? Yes: NPRS scale: 5-6/10 Pain location: Top left of shoulder blade and base of neck Pain description: "uncomfortable", numbness if sitting a certain way, can feel it creeping into R side Aggravating factors: sitting a certain way, can't lay on R side with L arm extended, can't  sit at desk for more than 5 min Relieving factors: N/A  PRECAUTIONS: None  WEIGHT BEARING RESTRICTIONS: No  FALLS:  Has patient fallen in last 6 months? 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 Shearon Stalls  OBJECTIVE:   DIAGNOSTIC FINDINGS:   Cervical Spine MRI 05/21/22 IMPRESSION: 1. Disc degeneration with multiple disc protrusions, most notable at C5-6 where there is mild-to-moderate spinal stenosis and severe left neural foraminal stenosis. 2. Mild spinal stenosis and moderate left neural foraminal stenosis at C4-5. 3. Mild spinal stenosis and moderate right neural foraminal stenosis at C6-7.  Thoracic Spine MRI 05/21/22 IMPRESSION: Small disc protrusion at T4-5 without stenosis.  Lumbar Spine MRI 05/21/22 IMPRESSION: 1. 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 No compressive spinal stenosis. 2. Moderate left and mild right neural foraminal stenosis at L4-5.  LUE and LLE EMG 05/02/22 Impression: This is a normal study. There is no definitive electrodiagnostic evidence of a large fiber  sensorimotor polyneuropathy, left cervical (C5-C8) or left lumbosacral (L3-S1) motor radiculopathy, left brachial plexopathy, or left median or ulnar mononeuropathy.  PATIENT SURVEYS:  NDI 12/50  COGNITION: Overall cognitive status: Within functional limits for tasks assessed  SENSATION: WFL Light touch sensation intact in BUE and BLE Proprioception intact  LLE just feels "weak" at times such as when descending stairs  POSTURE: rounded shoulders and forward head  PALPATION: TTP in L UT and periscapular region; tightness in L UT with L scapula abducted   CERVICAL ROM:   Active ROM A/PROM (deg) eval  Flexion 45  Extension 25 (pain in L levator scap)  Right lateral flexion 30 (pain in R lev scap)  Left lateral flexion 30 (pain and tingling in L levator scap)  Right rotation 45 (tingling in L side)  Left rotation 50   (Blank rows = not tested)  UPPER EXTREMITY ROM:  Active ROM Right eval Left eval  Shoulder flexion [facility]  Shoulder extension    Shoulder abduction [facility]  Shoulder adduction    Shoulder extension    Shoulder internal rotation    Shoulder external rotation    Elbow flexion WFL WFL  Elbow extension [facility] WFL  Wrist flexion    Wrist extension    Wrist ulnar deviation    Wrist radial deviation    Wrist pronation    Wrist supination     (Blank rows = not tested)  UPPER EXTREMITY MMT:  MMT Right eval Left eval  Shoulder flexion 5 5  Shoulder extension    Shoulder abduction 5 5  Shoulder adduction    Shoulder extension    Shoulder internal rotation    Shoulder external rotation    Middle trapezius    Lower trapezius    Elbow flexion 5 5  Elbow extension 5 5  Wrist flexion    Wrist extension    Wrist ulnar deviation    Wrist radial deviation    Wrist pronation    Wrist supination    Grip strength WFL WFL   (Blank rows = not tested)  CERVICAL SPECIAL TESTS:  Upper limb tension test (  ULTT): Positive, Spurling's test: Positive, and  Distraction test: Positive (+ for median nerve on L side)   TODAY'S TREATMENT:                                                                                                                              Manual Therapy: Cervical traction 5 x 45 sec with lateral SB to the R for L UT stretch Suboccipital release 3 x 30 sec each Pt with some tenderness in L UT along insertion at base of skull down into his shoulder area, encouraged pt to perform suboccipital release with tennis balls at home and target area of UT insertion  TherAct: Assessed BLE calf and thigh circumference to compare, measured thighs at 5" above center of knee joint: R calf: 17.5" R thigh: 21"  L calf : 17 5/8" L thigh: 21 7/8"  Despite pt reports of mm wasting in his LLE and increased weakness of his limb his L thigh actually measures as larger than his R thigh.  Trigger Point Dry-Needling  Treatment instructions: Expect mild to moderate muscle soreness. S/S of pneumothorax if dry needled over a lung field, and to seek immediate medical attention should they occur. Patient verbalized understanding of these instructions and education.  Patient Consent Given: Yes Education handout provided: Previously provided Muscles treated: L splenius capitus, L UT Treatment response/outcome: deep ache/muscle cramp and muscle twitch detected in L UT   TherEx Cervical retraction x 10 reps with 5 sec hold Seated cervical rotation stretch, no stretch felt Standing pec doorway stretch on L side 3 x 30 sec each  Added appropriate exercises to HEP, see bolded below   PATIENT EDUCATION:  Education details: added to HEP, continue HEP Person educated: Patient Education method: Explanation and Handouts Education comprehension: verbalized understanding and needs further education  HOME EXERCISE PROGRAM: Access Code: [email or web address] URL: [email or web address] Date: 06/23/2022 Prepared by: Camille Bal  Exercises -  Seated Upper Trapezius Stretch  - 1 x daily - 4-5 x weekly - 1 sets - 2 reps - 45 seconds hold - Seated Levator Scapulae Stretch  - 1 x daily - 4-5 x weekly - 3 sets - 10 reps - Scapular retraction with resistance  - 1 x daily - 5 x weekly - 2 sets - 12 reps - Standing Median Nerve Glide  - 1 x daily - 5 x weekly - 2 sets - 10 reps - Seated Sciatic Tensioner  - 1 x daily - 5 x weekly - 1-2 sets - 12 reps - Supine Chin Tuck with Towel  - 1 x daily - 7 x weekly - 2 sets - 10 reps - 5 sec hold - Supine Suboccipital Release with Tennis Balls  - 1 x daily - 7 x weekly - 1 sets - 1 reps - 5-10 min hold - Doorway Pec Stretch at 90 Degrees Abduction  - 1 x daily - 7 x weekly - 1 sets - 5 reps -  30 sec hold  ASSESSMENT:  CLINICAL IMPRESSION: Emphasis of skilled PT session on performing manual therapy, TPDN, stretching, and strengthening exercises to address ongoing L cervical radiculopathy. Pt continues to be limited by his symptoms which are aggravated by postural dysfunction, muscle imbalances, and ongoing tightness in cervical and periscapular region. Pt continues to benefit from skilled PT services to address ongoing active symptoms and to promote improved symptom management. Continue POC.   OBJECTIVE IMPAIRMENTS: decreased knowledge of condition, decreased mobility, decreased ROM, impaired perceived functional ability, impaired sensation, impaired UE functional use, improper body mechanics, postural dysfunction, and pain.   ACTIVITY LIMITATIONS: sitting, sleeping, and stairs  PARTICIPATION LIMITATIONS: community activity  PERSONAL FACTORS: Time since onset of injury/illness/exacerbation and 1-2 comorbidities:    ADHD and Hyperlipidemiaare also affecting patient's functional outcome.   REHAB POTENTIAL: Fair time since onset; has tried PT in the past year with little success  CLINICAL DECISION MAKING: Stable/uncomplicated  EVALUATION COMPLEXITY: Low   GOALS: Goals reviewed with patient?  Yes  SHORT TERM GOALS=LONG TERM GOALS due to length of POC  LONG TERM GOALS: Target date: 07/12/2022  Pt will be independent with final HEP for improved strength, balance, transfers and gait. Baseline: not established at eval Goal status: INITIAL  2.  Pt will decrease score on NDI to 7/50 to demonstrate decreased disability level Baseline: 12/50 (4/1) Goal status: INITIAL  3.  Pt will demonstrate (-) testing of ULNT for median nerve in LUE Baseline: (+) for median nerve in LUE (4/1) Goal status: INITIAL  4.  Pt will 70/80 or 87.5% score on the LEFS to demonstrate decreased disability level Baseline: 64/80 or 80% (4/11) Goal status: INITIAL  5.  Pt will increase cervical extension by >/= 10 degrees to demonstrate improved function Baseline: 25 degrees cervical extension (4/1) Goal status: INITIAL    PLAN:  PT FREQUENCY: 1x/week  PT DURATION: 4 weeks  PLANNED INTERVENTIONS: Therapeutic exercises, Therapeutic activity, Neuromuscular re-education, Balance training, Gait training, Patient/Family education, Self Care, Joint mobilization, Stair training, Dry Needling, Electrical stimulation, Spinal manipulation, Spinal mobilization, Cryotherapy, Moist heat, Taping, Traction, Manual therapy, and Re-evaluation  PLAN FOR NEXT SESSION:  modify HEP prn.  L scapular stability and postural stabilization exercises-rows/scap squeezes, lat pulldowns, prone IYT vs seated IYT; also work on LLE strengthening; manual therapy (cervical traction) with lat SB, thoracic mobilization stretch vs thread the needle vs thoracic rotation   Peter Congo, PT, DPT, CSRS 06/29/2022, 8:43 AM

## 2022-07-05 ENCOUNTER — Ambulatory Visit: Payer: PRIVATE HEALTH INSURANCE | Admitting: Physical Therapy

## 2022-07-05 DIAGNOSIS — M5412 Radiculopathy, cervical region: Secondary | ICD-10-CM

## 2022-07-05 DIAGNOSIS — M6281 Muscle weakness (generalized): Secondary | ICD-10-CM

## 2022-07-05 DIAGNOSIS — M542 Cervicalgia: Secondary | ICD-10-CM

## 2022-07-05 NOTE — Therapy (Signed)
OUTPATIENT PHYSICAL THERAPY CERVICAL TREATMENT   Patient Name: Paul Woodard MRN: [id number] DOB:[birth year], 37 y.o., male Today's Date: 07/05/2022  END OF SESSION:  PT End of Session - 07/05/22 0935     Visit Number 4    Number of Visits 5   with eval   Date for PT Re-Evaluation 07/25/22    Authorization Type Generic Commercial    PT Start Time 0932    PT Stop Time 1010    PT Time Calculation (min) 38 min    Activity Tolerance Patient limited by pain    Behavior During Therapy [facility] for tasks assessed/performed               Past Medical History:  Diagnosis Date   ADHD    Hyperlipidemia    Past Surgical History:  Procedure Laterality Date   right shoulder rpair Right 2008   TONSILLECTOMY AND ADENOIDECTOMY  1998   Patient Active Problem List   Diagnosis Date Noted   Numbness and tingling 05/03/2022   Chronic left shoulder pain 05/03/2022   Left leg weakness 05/03/2022    PCP: Ed Blalock, DO  REFERRING PROVIDER: Angelina Sheriff, DO  REFERRING DIAG: [phone] (ICD-10-CM) - Chronic left shoulder pain R20.0,R20.2 (ICD-10-CM) - Numbness and tingling  THERAPY DIAG:  Muscle weakness (generalized)  Cervicalgia  Radiculopathy, cervical region  Rationale for Evaluation and Treatment: Rehabilitation  ONSET DATE: 06/06/2022 (date of referral)  SUBJECTIVE:                                                                                                                                                                                                         SUBJECTIVE STATEMENT: Pt reports that he felt great after PT session last week but when he woke on up on Friday he had tightness in the L side of his neck and had continuous numbness down his L arm since then. Pt reports sensation as "tightness" but does have pain if he tries to turn his head or do levator scap stretch. Pt has been trying icy hot and voltarin gel with no relief. Pt feels like he slept with  head in R SB position all night long.  Hand dominance: Left  PERTINENT HISTORY:  ADHD and Hyperlipidemia  PAIN:  Are you having pain? Yes: NPRS scale: 5-6/10 Pain location: Top left of shoulder blade and base of neck Pain description: "uncomfortable", numbness if sitting a certain way, can feel it creeping into R side Aggravating factors: sitting a certain way, can't lay on R side with L arm extended,  can't sit at desk for more than 5 min Relieving factors: N/A  PRECAUTIONS: None  WEIGHT BEARING RESTRICTIONS: No  FALLS:  Has patient fallen in last 6 months? 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 Shearon Stalls  OBJECTIVE:   DIAGNOSTIC FINDINGS:   Cervical Spine MRI 05/21/22 IMPRESSION: 1. Disc degeneration with multiple disc protrusions, most notable at C5-6 where there is mild-to-moderate spinal stenosis and severe left neural foraminal stenosis. 2. Mild spinal stenosis and moderate left neural foraminal stenosis at C4-5. 3. Mild spinal stenosis and moderate right neural foraminal stenosis at C6-7.  Thoracic Spine MRI 05/21/22 IMPRESSION: Small disc protrusion at T4-5 without stenosis.  Lumbar Spine MRI 05/21/22 IMPRESSION: 1. 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 No compressive spinal stenosis. 2. Moderate left and mild right neural foraminal stenosis at L4-5.  LUE and LLE EMG 05/02/22 Impression: This is a normal study. There is no definitive electrodiagnostic evidence of a large fiber sensorimotor polyneuropathy, left cervical  (C5-C8) or left lumbosacral (L3-S1) motor radiculopathy, left brachial plexopathy, or left median or ulnar mononeuropathy.  PATIENT SURVEYS:  NDI 12/50  COGNITION: Overall cognitive status: Within functional limits for tasks assessed  SENSATION: WFL Light touch sensation intact in BUE and BLE Proprioception intact  LLE just feels "weak" at times such as when descending stairs  POSTURE: rounded shoulders and forward head  PALPATION: TTP in L UT and periscapular region; tightness in L UT with L scapula abducted   CERVICAL ROM:   Active ROM A/PROM (deg) eval  Flexion 45  Extension 25 (pain in L levator scap)  Right lateral flexion 30 (pain in R lev scap)  Left lateral flexion 30 (pain and tingling in L levator scap)  Right rotation 45 (tingling in L side)  Left rotation 50   (Blank rows = not tested)  UPPER EXTREMITY ROM:  Active ROM Right eval Left eval  Shoulder flexion [facility]  Shoulder extension    Shoulder abduction [facility]  Shoulder adduction    Shoulder extension    Shoulder internal rotation    Shoulder external rotation    Elbow flexion WFL WFL  Elbow extension [facility] WFL  Wrist flexion    Wrist extension    Wrist ulnar deviation    Wrist radial deviation    Wrist pronation    Wrist supination     (Blank rows = not tested)  UPPER EXTREMITY MMT:  MMT Right eval Left eval  Shoulder flexion 5 5  Shoulder extension    Shoulder abduction 5 5  Shoulder adduction    Shoulder extension    Shoulder internal rotation    Shoulder external rotation    Middle trapezius    Lower trapezius    Elbow flexion 5 5  Elbow extension 5 5  Wrist flexion    Wrist extension    Wrist ulnar deviation    Wrist radial deviation    Wrist pronation    Wrist supination    Grip strength WFL WFL   (Blank rows = not tested)  CERVICAL SPECIAL TESTS:  Upper limb tension  test (ULTT): Positive, Spurling's test: Positive, and Distraction test: Positive (+ for median nerve  on L side)   TODAY'S TREATMENT:                                                                                                                              Manual Therapy: Cervical traction 5 x 45 sec with lateral SB to the R for L UT stretch Pt with increase in tolerated ROM with each repetition of stretch Levator scap stretch 4 x 20 sec each, some increase in ROM following stretching  TherAct: Demonstrated use of theracane to target trigger points in L UT. Can use again in future sessions and provide handout for purchase if pt interested.  Trigger Point Dry-Needling  Treatment instructions: Expect mild to moderate muscle soreness. S/S of pneumothorax if dry needled over a lung field, and to seek immediate medical attention should they occur. Patient verbalized understanding of these instructions and education.  Patient Consent Given: Yes Education handout provided: Previously provided Muscles treated: L suboccipitals, L upper trap Treatment response/outcome: deep ache/muscle cramp; very sensitive in L UT but muscle twitch detected   PATIENT EDUCATION:  Education details: continue HEP, use hot pack for muscle relaxation, continue to use Voltarin but NOT with heat Person educated: Patient Education method: Explanation and Handouts Education comprehension: verbalized understanding and needs further education  HOME EXERCISE PROGRAM: Access Code: [email or web address] URL: [email or web address] Date: 06/23/2022 Prepared by: Camille Bal  Exercises - Seated Upper Trapezius Stretch  - 1 x daily - 4-5 x weekly - 1 sets - 2 reps - 45 seconds hold - Seated Levator Scapulae Stretch  - 1 x daily - 4-5 x weekly - 3 sets - 10 reps - Scapular retraction with resistance  - 1 x daily - 5 x weekly - 2 sets - 12 reps - Standing Median Nerve Glide  - 1 x daily - 5 x weekly - 2 sets - 10 reps - Seated Sciatic Tensioner  - 1 x daily - 5 x weekly - 1-2 sets - 12 reps - Supine Chin Tuck with  Towel  - 1 x daily - 7 x weekly - 2 sets - 10 reps - 5 sec hold - Supine Suboccipital Release with Tennis Balls  - 1 x daily - 7 x weekly - 1 sets - 1 reps - 5-10 min hold - Doorway Pec Stretch at 90 Degrees Abduction  - 1 x daily - 7 x weekly - 1 sets - 5 reps - 30 sec hold  ASSESSMENT:  CLINICAL IMPRESSION: Emphasis of skilled PT session on performing manual therapy, TPDN, stretching, and strengthening exercises to address ongoing L cervical radiculopathy. Pt reports some relief of pain following treatment this date with improved cervical ROM. Pt continues to benefit from skilled therapy services to work towards improving overall pain, ROM, and symptom management. Continue POC.   OBJECTIVE IMPAIRMENTS: decreased knowledge of condition, decreased mobility, decreased ROM, impaired perceived functional  ability, impaired sensation, impaired UE functional use, improper body mechanics, postural dysfunction, and pain.   ACTIVITY LIMITATIONS: sitting, sleeping, and stairs  PARTICIPATION LIMITATIONS: community activity  PERSONAL FACTORS: Time since onset of injury/illness/exacerbation and 1-2 comorbidities:    ADHD and Hyperlipidemiaare also affecting patient's functional outcome.   REHAB POTENTIAL: Fair time since onset; has tried PT in the past year with little success  CLINICAL DECISION MAKING: Stable/uncomplicated  EVALUATION COMPLEXITY: Low   GOALS: Goals reviewed with patient? Yes  SHORT TERM GOALS=LONG TERM GOALS due to length of POC  LONG TERM GOALS: Target date: 07/12/2022  Pt will be independent with final HEP for improved strength, balance, transfers and gait. Baseline: not established at eval Goal status: INITIAL  2.  Pt will decrease score on NDI to 7/50 to demonstrate decreased disability level Baseline: 12/50 (4/1) Goal status: INITIAL  3.  Pt will demonstrate (-) testing of ULNT for median nerve in LUE Baseline: (+) for median nerve in LUE (4/1) Goal status:  INITIAL  4.  Pt will 70/80 or 87.5% score on the LEFS to demonstrate decreased disability level Baseline: 64/80 or 80% (4/11) Goal status: INITIAL  5.  Pt will increase cervical extension by >/= 10 degrees to demonstrate improved function Baseline: 25 degrees cervical extension (4/1) Goal status: INITIAL    PLAN:  PT FREQUENCY: 1x/week  PT DURATION: 4 weeks  PLANNED INTERVENTIONS: Therapeutic exercises, Therapeutic activity, Neuromuscular re-education, Balance training, Gait training, Patient/Family education, Self Care, Joint mobilization, Stair training, Dry Needling, Electrical stimulation, Spinal manipulation, Spinal mobilization, Cryotherapy, Moist heat, Taping, Traction, Manual therapy, and Re-evaluation  PLAN FOR NEXT SESSION:  modify HEP prn.  L scapular stability and postural stabilization exercises-rows/scap squeezes, lat pulldowns, prone IYT vs seated IYT; also work on LLE strengthening; manual therapy (cervical traction) with lat SB, thoracic mobilization stretch vs thread the needle vs thoracic rotation, schedule 2 more appts next week (recert)   Peter Congo, PT, DPT, CSRS 07/05/2022, 10:14 AM

## 2022-07-11 NOTE — Progress Notes (Signed)
Initial neurology clinic note  SERVICE DATE: 07/20/22  Reason for Evaluation: Consultation requested by Angelina Sheriff, DO for an opinion regarding left sided weakness and numbness/tingling. My final recommendations will be communicated back to the requesting physician by way of shared medical record or letter to requesting physician via [place] mail.  HPI: This is Mr. Paul Woodard, a 37 y.o. left-handed male with a medical history of HLD, ADHD who presents to neurology clinic with the chief complaint of left side weakness, numbness, and tingling. The patient is accompanied by wife.  Patient's symptoms started in the left arm over a year ago. He will have intermittent numbness and tingling, depending on position (like laying on right side). It is the entire arm. He denies any weakness. It happens multiple times per day. If he moves positions, symptoms will resolve (may last seconds). He denies neck pain, but does have pain in the left shoulder. If he moves his neck back, he can have pain there.  About 6 months ago, patient noticed his left leg felt weaker. He feels he is losing muscle on that side. He states it feels like his leg is "out of body" like a numbness. His knee can give. He gets pain in the left hip with a "click" but no clear pain. He denies back pain. He did have one fall (~6 months ago) when going up the stairs when the left leg gave out. That is when he first noticed symptoms. He does not get numbness and tingling in the leg. The leg is not always week. He mentions he is very active at work, lifting heavy things like kegs, without problems most of the time.  Patient has been doing PT (prescribed by PCP). He states it was going well for a few weeks. He mentions he slept wrong before last appointment and had difficulty moving his neck. He feels PT has helped with neck mobility, but not changed symptoms on left side. He has also done dry needling.  He is currently on meloxicam and  gabapentin 300 mg TID. Meloxicam may help some. He has not noticed any relief with gabapentin.  The patient denies symptoms suggestive of oculobulbar weakness including diplopia, ptosis, dysphagia, poor saliva control, dysarthria/dysphonia, impaired mastication, facial weakness/droop.  There are no neuromuscular respiratory weakness symptoms, particularly orthopnea>dyspnea.   He does not report any constitutional symptoms like fever, night sweats, anorexia or unintentional weight loss. He mentions some skin spots on both feet (left > right) that is not itch or spread. There is no clear cause per patient.  EtOH use: 3-7 beers per day  Restrictive diet? No Family history of neuropathy/myopathy/neurologic disease? No  Patient had an EMG by me on 05/02/22 that was normal. Spine imaging was completed in 05/2022 and showed potential left C5 impingement, severe left C6 neural foraminal stenosis and potential impingement of right C7. Lumbar spine showed moderate left L4-L5 neural foraminal stenosis.   MEDICATIONS:  Outpatient Encounter Medications as of 07/20/2022  Medication Sig   amphetamine-dextroamphetamine (ADDERALL XR) 30 MG 24 hr capsule    busPIRone (BUSPAR) 15 MG tablet Take by mouth.   cetirizine (ZYRTEC) 10 MG tablet Take 1 tablet by mouth daily.   gabapentin (NEURONTIN) 300 MG capsule Start with 1 capsule twice daily. After 1 week, if no side effects, can increase to 1 capsule three times daily   meloxicam (MOBIC) 15 MG tablet Take by mouth.   No facility-administered encounter medications on file as of 07/20/2022.  PAST MEDICAL HISTORY: Past Medical History:  Diagnosis Date   ADHD    Hyperlipidemia     PAST SURGICAL HISTORY: Past Surgical History:  Procedure Laterality Date   right shoulder rpair Right 2008   TONSILLECTOMY AND ADENOIDECTOMY  1998    ALLERGIES: No Known Allergies  FAMILY HISTORY: Family History  Problem Relation Age of Onset   Hypothyroidism Mother      SOCIAL HISTORY: Social History   Tobacco Use   Smoking status: Never   Smokeless tobacco: Never  Vaping Use   Vaping Use: Never used  Substance Use Topics   Alcohol use: Yes    Comment: daily beer +   Drug use: Not Currently   Social History   Social History Narrative   Are you right handed or left handed? Left   Are you currently employed ?    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   Who lives with you? Wife    What type of home do you live in: 1 story or 2 story? one   Caffeine 300 mg      OBJECTIVE: PHYSICAL EXAM: BP 126/71   Pulse 90   Ht 6\' 2"  (1.88 m)   Wt 252 lb (114.3 kg)   SpO2 96%   BMI 32.35 kg/m   General: General appearance: Awake and alert. No distress. Cooperative with exam.  HEENT: Atraumatic. Anicteric. Patient has symptoms with bending neck to right or left (can produce symptoms bilaterally). Lungs: Non-labored breathing on room air  Extremities: No edema. No obvious deformity.  Musculoskeletal: No obvious joint swelling. Psych: Affect appropriate.  Neurological: Mental Status: Alert. Speech fluent. No pseudobulbar affect Cranial Nerves: CNII: No RAPD. Visual fields grossly intact. CNIII, IV, VI: PERRL. No nystagmus. EOMI. CN V: Facial sensation intact bilaterally to fine touch. CN VII: Facial muscles symmetric and strong. No ptosis at rest. CN VIII: Hearing grossly intact bilaterally. CN IX: No hypophonia. CN X: Palate elevates symmetrically. CN XI: Full strength shoulder shrug bilaterally. CN XII: Tongue protrusion full and midline. No atrophy or fasciculations. No significant dysarthria Motor: Tone is normal. No fasciculations in any extremities. No obvious atrophy.  Individual muscle group testing (MRC grade out of 5):  Movement     Neck flexion 5    Neck extension 5     Right Left   Shoulder abduction 5 5   Shoulder adduction 5 5   Shoulder ext rotation 5 5   Shoulder int rotation 5  5   Elbow flexion 5 5   Elbow extension 5 5   Wrist extension 5 5   Wrist flexion 5 5   Finger abduction - FDI 5 5   Finger abduction - ADM 5 5   Finger extension 5 5   Finger distal flexion - 2/3 5 5    Finger distal flexion - 4/5 5 5    Thumb flexion - FPL 5 5   Thumb abduction - APB 5 5    Hip flexion 5 5   Hip extension 5 5   Hip adduction 5 5   Hip abduction 5 5   Knee extension 5 5   Knee flexion 5 5   Dorsiflexion 5 5   Plantarflexion 5 5   Inversion 5 5   Eversion 5 5   Great toe extension 5 5   Great toe flexion 5 5     Reflexes:  Right Left   Bicep 2+ 2+  Tricep 2+ 2+   BrRad 2+ 2+   Knee 2+ 2+   Ankle 2+ 2+    Pathological Reflexes: Babinski: flexor response bilaterally Hoffman: absent bilaterally Troemner: absent bilaterally Sensation: Pinprick: Intact in all extremities Vibration: Intact in all extremities Proprioception: Intact in bilateral great toes. Coordination: Intact finger-to- nose-finger bilaterally. Romberg negative. Gait: Able to rise from chair with arms crossed unassisted. Normal, narrow-based gait. Able to tandem walk. Able to walk on toes and heels.  Lab and Test Review: Imaging: CT left shoulder wo contraast (08/12/21): IMPRESSION: 1. Unremarkable CT examination of the left shoulder. Evaluation of early degeneration injury is limited on CT scans. Further evaluation with MRI examination of the upper extremity or brachial plexus could be obtained as clinically warranted.  MRI cervical spine wo contrast (05/21/22): FINDINGS: Alignment: Normal.   Vertebrae: No fracture, suspicious marrow lesion, or significant marrow edema.   Cord: Normal signal.   Posterior Fossa, vertebral arteries, paraspinal tissues: Unremarkable.   Disc levels:   C2-3: Mild uncovertebral spurring without significant stenosis.   C3-4: A right foraminal disc osteophyte complex results in mild-to-moderate right neural foraminal stenosis. Patent  spinal canal and left neural foramen.   C4-5: Disc bulging, a left paracentral to left foraminal disc protrusion, and uncovertebral spurring result in mild spinal stenosis and mild right and moderate left neural foraminal stenosis with potential left C5 nerve root impingement.   C5-6: Mild disc bulging, a left paracentral to foraminal disc protrusion, and uncovertebral spurring result in mild-to-moderate spinal stenosis with mild left-sided cord flattening and severe left neural foraminal stenosis with left C6 nerve root impingement.   C6-7: Mild disc bulging, a right paracentral to right foraminal disc protrusion, and mild uncovertebral spurring result in mild spinal stenosis and moderate right neural foraminal stenosis with potential right C7 nerve root impingement.   C7-T1: Negative.   IMPRESSION: 1. Disc degeneration with multiple disc protrusions, most notable at C5-6 where there is mild-to-moderate spinal stenosis and severe left neural foraminal stenosis. 2. Mild spinal stenosis and moderate left neural foraminal stenosis at C4-5. 3. Mild spinal stenosis and moderate right neural foraminal stenosis at C6-7.  MRI thoracic spine wo contrast (05/21/22): FINDINGS: Alignment:  Normal.   Vertebrae: No fracture or significant marrow edema. 2 subcentimeter STIR hyperintense foci in the T6 vertebral body, indeterminate but favored to reflect a benign etiology such as atypical hemangiomas in the absence of a history of malignancy.   Cord:  Normal signal.   Paraspinal and other soft tissues: Unremarkable.   Disc levels:   Small central/right central disc protrusion at T4-5 contacts and slightly flattens the ventral spinal cord without significant stenosis. The other thoracic disc levels are unremarkable. Mild facet hypertrophy in the lower thoracic spine. Patent neural foramina.   IMPRESSION: Small disc protrusion at T4-5 without stenosis.  MRI lumbar spine wo contrast  (05/21/22): FINDINGS: Segmentation:  Standard.   Alignment: Mild straightening of the normal lumbar lordosis. No listhesis.   Vertebrae: No fracture, suspicious marrow lesion, or significant marrow edema.   Conus medullaris and cauda equina: Conus extends to the L1-2 level. Conus and cauda equina appear normal.   Paraspinal and other soft tissues: Unremarkable.   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   T12-L1: Negative.   L1-2: Slight facet and ligamentum flavum hypertrophy without disc herniation or stenosis.   L2-3: Minimal disc bulging greater to the left and mild facet  and ligamentum flavum hypertrophy without significant stenosis.   L3-4: Mild disc bulging and mild facet and ligamentum flavum hypertrophy without significant stenosis.   L4-5: Left eccentric disc bulging and moderate facet hypertrophy result in mild right and moderate left neural foraminal stenosis. No significant spinal stenosis.   L5-S1: Mild disc bulging and mild facet hypertrophy without significant stenosis.   IMPRESSION: 1. 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 No compressive spinal stenosis. 2. Moderate left and mild right neural foraminal stenosis at L4-5.  EMG (05/02/22): NCV & EMG Findings: Extensive electrodiagnostic evaluation of the left upper and lower limbs with additional nerve conduction studies of the right lower limb shows: Left sural, superficial peroneal/fibular, median, ulnar, and radial sensory responses are within normal limits. Left peroneal/fibular (EDB), tibial (AH), median (APB), and ulnar (ADM) motor responses are within normal limits. Bilateral H reflexes are absent, likely technical in nature. There is no evidence of active or chronic motor axon loss changes affecting any of the tested muscles on needle examination. Motor unit configuration and recruitment pattern  is within normal limits.   Impression: This is a normal study. There is no definitive electrodiagnostic evidence of a large fiber sensorimotor polyneuropathy, left cervical (C5-C8) or left lumbosacral (L3-S1) motor radiculopathy, left brachial plexopathy, or left median or ulnar mononeuropathy.  ASSESSMENT: Paul Woodard is a 37 y.o. male who presents for evaluation of intermittent left arm numbness and tingling and intermittent left leg weakness. He has a relevant medical history of ADHD and HLD. His neurological examination is essentially normal today. I was able to reproduce symptoms in his arms with neck bending to either side. While patient describes atrophy or asymmetry of LLE, I do not see this on my exam. Patient's symptoms in the arms are likely due to the neural foraminal stenosis seen on MRI cervical spine. Fortunately, his exam is normal when not bending his neck and EMG showed no evidence of nerve damage. It may be that certain positions cause momentary contact with the nerves producing symptoms. His left leg symptoms are more difficult to explain and may be related to left hip or knee pain versus the moderate left L4-L5 neural foraminal stenosis. It sounds like his job requires heavy lifting and could be hard on his neck and back.  PLAN: -Continue physical therapy -Continue meloxicam -Could consider weaning off gabapentin as it does not appear to be working -Discussed safe lifting and being careful with neck and back to avoid further damage -Discussed EtOH use and neurotoxicity that could be possible in the future based on current use  -Return to clinic as needed.  The impression above as well as the plan as outlined below were extensively discussed with the patient (in the company of wife) who voiced understanding. All questions were answered to their satisfaction.  When available, results of the above investigations and possible further recommendations will be communicated to the  patient via telephone/MyChart. Patient to call office if not contacted after expected testing turnaround time.   Total time spent reviewing records, interview, history/exam, documentation, and coordination of care on day of encounter:  60 min   Thank you for allowing me to participate in patient's care.  If I can answer any additional questions, I would be pleased to do so.  Jacquelyne Balint, MD   CC: Ed Blalock, DO [address] [place] [address]  CC: Referring provider: Angelina Sheriff, DO [address] [place],  [place] [address]

## 2022-07-12 ENCOUNTER — Ambulatory Visit: Payer: PRIVATE HEALTH INSURANCE | Admitting: Physical Therapy

## 2022-07-20 ENCOUNTER — Encounter: Payer: Self-pay | Admitting: Neurology

## 2022-07-20 ENCOUNTER — Ambulatory Visit: Payer: PRIVATE HEALTH INSURANCE | Admitting: Neurology

## 2022-07-20 VITALS — BP 126/71 | HR 90 | Ht 74.0 in | Wt 252.0 lb

## 2022-07-20 DIAGNOSIS — R202 Paresthesia of skin: Secondary | ICD-10-CM

## 2022-07-20 DIAGNOSIS — R29898 Other symptoms and signs involving the musculoskeletal system: Secondary | ICD-10-CM

## 2022-07-20 DIAGNOSIS — R200 Anesthesia of skin: Secondary | ICD-10-CM | POA: Diagnosis not present

## 2022-07-20 NOTE — Patient Instructions (Addendum)
I saw you today for intermittent numbness and tingling of the left arm and weakness of the left leg. Your exam today was normal and EMG showed no evidence of current or ongoing nerve damage.  I would recommend you continue physical therapy. I would also recommend being careful with lifting and any pressure on your neck or back.  You can continue to take gabapentin, but given that it is not helping, you could try stopping as well.  Please let me know if you have any questions or concerns as I am always available to you. Follow up with me as needed.  The physicians and staff at [facility] are committed to providing excellent care. You may receive a survey requesting feedback about your experience at our office. We strive to receive "very good" responses to the survey questions. If you feel that your experience would prevent you from giving the office a "very good " response, please contact our office to try to remedy the situation. We may be reached at [phone]. Thank you for taking the time out of your busy day to complete the survey.  Jacquelyne Balint, MD [facility]

## 2022-08-02 NOTE — Progress Notes (Signed)
Subjective:    Patient ID: Paul Woodard, male    DOB: [birth year], 37 y.o.   MRN: [id number]  HPI  Paul Woodard is a 37 y.o. year old male  who  has a past medical history of ADHD and Hyperlipidemia.   They are presenting to PM&R clinic for follow up related to left shoulder and left leg pain and weakness .  Plan from last visit:  Chronic left shoulder pain Assessment & Plan: EMG BL UE wiuth [place] normal   MRI cervical spine 2024:  IMPRESSION: 1. Disc degeneration with multiple disc protrusions, most notable at C5-6 where there is mild-to-moderate spinal stenosis and severe left neural foraminal stenosis. 2. Mild spinal stenosis and moderate left neural foraminal stenosis at C4-5. 3. Mild spinal stenosis and moderate right neural foraminal stenosis at C6-7.   I have referred you to physical therapy for your neck and shoulder.   Orders: -     Ambulatory referral to Physical Therapy   Left leg weakness Assessment & Plan: LLE EMG with [place] normal   MRI lumbar c/t/l spine 2024 without severe spinal stenosis; lumbar findings:  IMPRESSION: 1. 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 No compressive spinal stenosis. 2. Moderate left and mild right neural foraminal stenosis at L4-5.   I referred you to neurology for neuromuscular evaluation for your left lower extremity.   Follow-up with me in 2 months.   Orders: -     Ambulatory referral to Neurology   Numbness and tingling Assessment & Plan: I have sent a prescription for gabapentin into your pharmacy.  Please take one 300 mg capsule twice a day.  If no side effects at this dose, can increase to one 300 mg capsule 3 times daily.     Orders: -     Ambulatory referral to Physical Therapy   Interval Hx:  - Therapies: PT has been helpful so far; he said he sept on his neck wrong one night and was miserable for a full week.   - Follow ups: Saw Dr. Loleta Chance 5/8; no new recommendations. "his exam is  normal when not bending his neck and EMG showed no evidence of nerve damage. It may be that certain positions cause momentary contact with the nerves producing symptoms. His left leg symptoms are more difficult to explain and may be related to left hip or knee pain versus the moderate left L4-L5 neural foraminal stenosis. It sounds like his job requires heavy lifting and could be hard on his neck and back. "   - Falls: none; no near misses.    - DME: None   - Medications: Stopped gabapentin because it was not helpful; Dr. Loleta Chance told him to stop it because he can adjust it with positioning. No s/e when taking it. He is not interested in medicaiton management at this point, wants to use positioning to assist in pain control.   Using voltaren on the left shoulder, without benefit. Uses at nighttime before bed.    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 He feels like he does not use his shoulders much at his workplace to this is not exacerbated.   Pain Inventory Average Pain 5 Pain Right Now 3 My pain is intermittent, burning, stabbing, and tingling  In the last 24 hours, has pain interfered with the following? General activity 5 Relation with others 3 Enjoyment of life 5 What TIME of day  is your pain at its worst? morning  and daytime Sleep (in general) Fair  Pain is worse with: unsure Pain improves with: rest and therapy/exercise Relief from Meds: 0  Family History  Problem Relation Age of Onset   Hypothyroidism Mother    Social History   Socioeconomic History   Marital status: Married    Spouse name: Not on file   Number of children: Not on file   Years of education: Not on file   Highest education level: Not on file  Occupational History   Not on file  Tobacco Use   Smoking status: Never   Smokeless tobacco: Never  Vaping Use   Vaping Use: Never used  Substance and Sexual Activity   Alcohol use:  Yes    Comment: daily beer +   Drug use: Not Currently   Sexual activity: Not on file  Other Topics Concern   Not on file  Social History Narrative   Are you right handed or left handed? Left   Are you currently employed ?    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   Who lives with you? Wife    What type of home do you live in: 1 story or 2 story? one   Caffeine 300 mg    Social Determinants of Health   Financial Resource Strain: Not on file  Food Insecurity: Not on file  Transportation Needs: Not on file  Physical Activity: Not on file  Stress: Not on file  Social Connections: Not on file   Past Surgical History:  Procedure Laterality Date   right shoulder rpair Right 2008   TONSILLECTOMY AND ADENOIDECTOMY  1998   Past Surgical History:  Procedure Laterality Date   right shoulder rpair Right 2008   TONSILLECTOMY AND ADENOIDECTOMY  1998   Past Medical History:  Diagnosis Date   ADHD    Hyperlipidemia    There were no vitals taken for this visit.  Opioid Risk Score:   Fall Risk Score:  `1  Depression screen [facility] 2/9     05/02/2022    2:38 PM  Depression screen PHQ 2/9  Decreased Interest 1  Down, Depressed, Hopeless 0  PHQ - 2 Score 1  Altered sleeping 2  Tired, decreased energy 2  Change in appetite 2  Feeling bad or failure about yourself  0  Trouble concentrating 3  Moving slowly or fidgety/restless 2  Suicidal thoughts 0  PHQ-9 Score 12    Review of Systems  Musculoskeletal:        Pain in the left shoulder down to the left hand. Also pain in the left hip.   All other systems reviewed and are negative.     Objective:   Physical Exam   PE: Constitution: Appropriate appearance for age. No apparent distress  Resp: No respiratory distress. No accessory muscle usage. on RA and CTAB Cardio: Well perfused appearance. No peripheral edema. Abdomen: Nondistended. Nontender.   Psych: Appropriate mood and  affect. Neuro: AAOx4. No apparent cognitive deficits   Neurologic Exam:   Sensory exam: revealed normal sensation in all dermatomal regions in bilateral lower extremities  + Spurling's bilaterally with radiating pain down left arm posteriorly into 5th digit  Motor exam: strength 5/5 throughout bilateral upper extremities and bilateral lower extremities  MSK: No gross deformities. + LLE calf appears retracted vs atrophied compared to R. Negative calf squeeze. Full ROM DF, PF.  Limited ROM in neck  extension, sidebending b/l + Groin pain with L FABER + feeling of instability with R FADIR, compression, and internal hip rotation    Assessment & Plan:   Paul Woodard is a 36 y.o. year old male  who  has a past medical history of ADHD and Hyperlipidemia.      They are presenting to PM&R clinic for follow up related to left shoulder and left leg pain and weakness .  Chronic left shoulder pain Per Neurology eval, EMG showed no evidence of nerve damage, may be that certain positions cause momentary contact with the nerves producing symptoms.   Continue PT and develop home exercises and improve posture for long-term pain control  Consider obtaining a cervical pillow if nighttime positioning exacerbates your pain  Use voltaren gel twice daily, heat, and massage for chronic pain control. You can use gabapentin 300 mg 1-2 tabs as needed once daily for severe pain, but DO NOT use this with alcohol.  Consider obtaining a theracane for self-massage of the neck and posterior shoulders  Follow up as needed (see below)   Muscle spasm of left lower extremity Left leg weakness Per Dr. Loleta Chance, no chronic nerve impingement, no surgical indications at this time.  Based on exam, suspect L hip arthritis vs labral tear  Obtain hip xray. If results show arthritis,I will bring you back for an ultrasound guided injection to help your pain. If negative, I will order an utrasound of the L hip to look for  possible labral tear.   Alcohol use Continue to cut back; currently drinking 5 beers/day; discussed goal of =<1 beer/day

## 2022-08-03 ENCOUNTER — Encounter
Payer: PRIVATE HEALTH INSURANCE | Attending: Physical Medicine and Rehabilitation | Admitting: Physical Medicine and Rehabilitation

## 2022-08-03 ENCOUNTER — Encounter: Payer: Self-pay | Admitting: Physical Medicine and Rehabilitation

## 2022-08-03 VITALS — BP 127/88 | HR 83 | Ht 74.0 in | Wt 253.0 lb

## 2022-08-03 DIAGNOSIS — M62838 Other muscle spasm: Secondary | ICD-10-CM | POA: Diagnosis not present

## 2022-08-03 DIAGNOSIS — Z789 Other specified health status: Secondary | ICD-10-CM | POA: Diagnosis not present

## 2022-08-03 DIAGNOSIS — G8929 Other chronic pain: Secondary | ICD-10-CM | POA: Insufficient documentation

## 2022-08-03 DIAGNOSIS — R29898 Other symptoms and signs involving the musculoskeletal system: Secondary | ICD-10-CM | POA: Insufficient documentation

## 2022-08-03 DIAGNOSIS — M25512 Pain in left shoulder: Secondary | ICD-10-CM | POA: Insufficient documentation

## 2022-08-03 NOTE — Patient Instructions (Signed)
Chronic left shoulder pain Continue PT and develop home exercises and I'm[prove posture for long-term pain control  Consider obtaining a cervical pillow if nighttime positioning exacerbates your pain  Use voltaren gel twice daily, heat, and massage for chronic pain control. You can use gabapentin 300 mg 1-2 tabs as needed once daily for severe pain, but DO NOT use this with alcohol.  Consider obtaining a theracane for self-massage of the neck and posterior shoulders  Follow up as needed (see below)   Muscle spasm of left lower extremity Left leg weakness  Per Dr. Loleta Chance, no chronic nerve impingement, no surgical indications at this time.  Based on exam, suspect L hip arthritis vs labral tear  Obtain hip xray. If results show arthritis,I will bring you back for an ultrasound guided injection to help your pain. If negative, I will order an utrasound of the L hip to look for possible labral tear.   Alcohol use Continue to cut back; currently drinking 5 beers/day; discussed goal of 1 beer/day

## 2022-10-17 ENCOUNTER — Encounter: Payer: Self-pay | Admitting: Physical Medicine and Rehabilitation

## 2023-05-08 IMAGING — CT CT SHOULDER*L* W/O CM
3 of 12 series · 6 of 33 positions shown, 7 images · non-contrast
Comparison: None Available.

CLINICAL DATA: Losing sensation in left arm.

EXAM:
CT OF THE UPPER LEFT EXTREMITY WITHOUT CONTRAST
TECHNIQUE: Multidetector CT imaging of the upper left extremity was performed
according to the standard protocol.
RADIATION DOSE REDUCTION: This exam was performed according to the
departmental dose-optimization program which includes automated
exposure control, adjustment of the mA and/or kV according to
patient size and/or use of iterative reconstruction technique.

[Series 4: ax bone · axial · 0.39mm/px · z∈[+1439,+1487]mm · 2 of 74 slices shown, 3 images]
[im 25/74  soft-tissue]
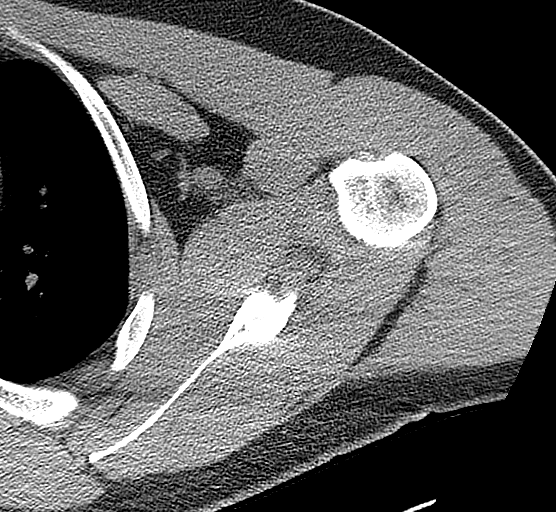
[im 25/74  bone]
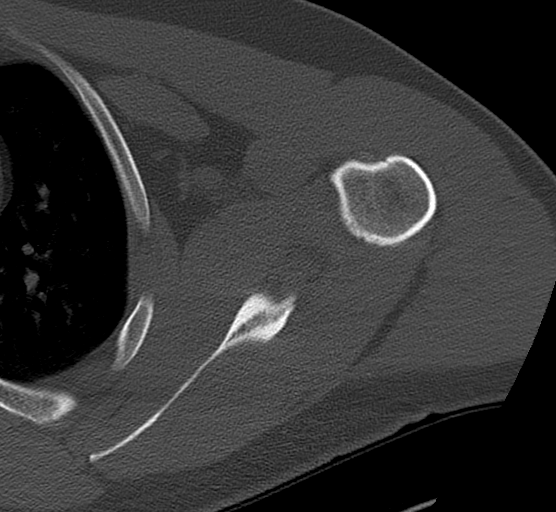
[im 49/74  bone]
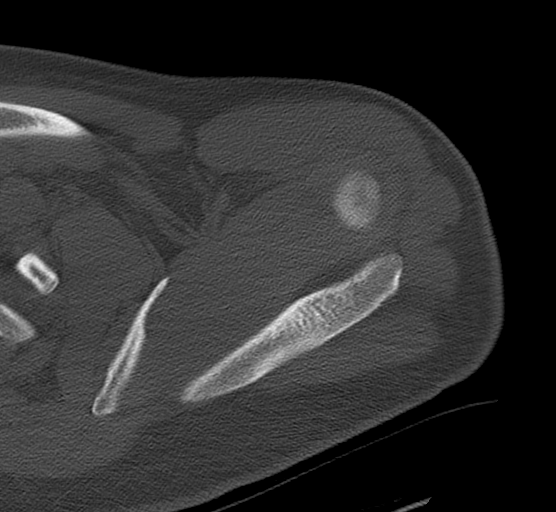

[Series 6: sag bone · sagittal · 0.29mm/px · 2 of 109 slices shown]
[im 37/109  bone]
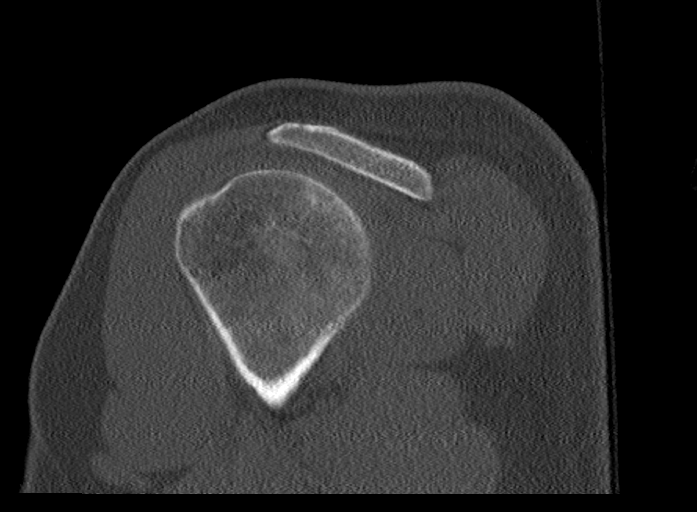
[im 73/109  bone]
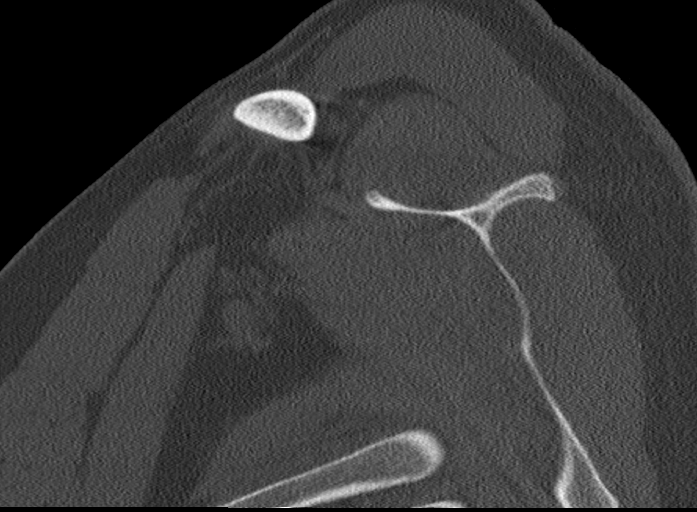

[Series 7: ax st · axial · 0.39mm/px · z∈[+1439,+1487]mm · 2 of 74 slices shown]
[im 25/74  bone]
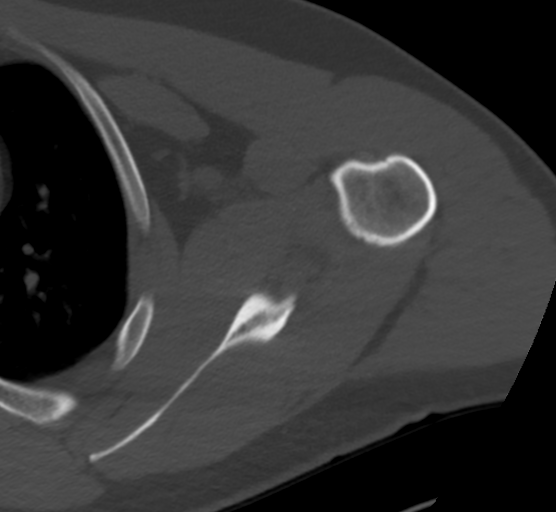
[im 49/74  bone]
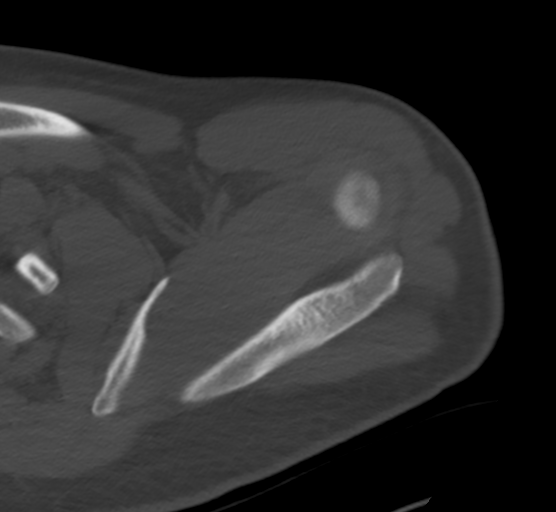

[6 of 33 positions shown; findings below may reference images not displayed]

FINDINGS: Bones/Joint/Cartilage

No fracture or dislocation. Normal alignment. No joint effusion.
Glenohumeral joint and acromioclavicular joints are unremarkable.

Ligaments

Ligaments are suboptimally evaluated by CT.

Muscles and Tendons
Rotator cuff muscles, deltoid are normal in bulk. No evidence of
fatty infiltration. Quadriceps tendon appears maintained.

Soft tissue
No fluid collection or hematoma.  No soft tissue mass.
IMPRESSION: 1. Unremarkable CT examination of the left shoulder. Evaluation of
early degeneration injury is limited on CT scans. Further evaluation
with MRI examination of the upper extremity or brachial plexus could
be obtained as clinically warranted.
